# Patient Record
Sex: Female | Born: 1945 | Race: White | Hispanic: No | Marital: Married | Smoking: Former smoker
Health system: Southern US, Community
[De-identification: ages and names within clinical notes are randomized; demographics above are authoritative.]

## PROBLEM LIST (undated history)

## (undated) DIAGNOSIS — J449 Chronic obstructive pulmonary disease, unspecified: Secondary | ICD-10-CM

## (undated) HISTORY — PX: BACK SURGERY: SHX140

---

## 2020-06-22 ENCOUNTER — Encounter (HOSPITAL_COMMUNITY): Payer: Self-pay | Admitting: Emergency Medicine

## 2020-06-22 ENCOUNTER — Inpatient Hospital Stay (HOSPITAL_COMMUNITY)
Admission: EM | Admit: 2020-06-22 | Discharge: 2020-06-29 | DRG: 481 | Disposition: A | Discharge status: Skilled Nursing Facility | Payer: Medicare Other | Attending: Family Medicine | Admitting: Family Medicine

## 2020-06-22 ENCOUNTER — Other Ambulatory Visit: Payer: Self-pay

## 2020-06-22 ENCOUNTER — Emergency Department (HOSPITAL_COMMUNITY): Payer: Medicare Other

## 2020-06-22 DIAGNOSIS — Z6826 Body mass index (BMI) 26.0-26.9, adult: Secondary | ICD-10-CM

## 2020-06-22 DIAGNOSIS — D72828 Other elevated white blood cell count: Secondary | ICD-10-CM | POA: Diagnosis present

## 2020-06-22 DIAGNOSIS — I890 Lymphedema, not elsewhere classified: Secondary | ICD-10-CM | POA: Diagnosis present

## 2020-06-22 DIAGNOSIS — D62 Acute posthemorrhagic anemia: Secondary | ICD-10-CM | POA: Diagnosis not present

## 2020-06-22 DIAGNOSIS — S72002A Fracture of unspecified part of neck of left femur, initial encounter for closed fracture: Secondary | ICD-10-CM | POA: Diagnosis not present

## 2020-06-22 DIAGNOSIS — D649 Anemia, unspecified: Secondary | ICD-10-CM | POA: Diagnosis present

## 2020-06-22 DIAGNOSIS — R944 Abnormal results of kidney function studies: Secondary | ICD-10-CM | POA: Diagnosis present

## 2020-06-22 DIAGNOSIS — D72829 Elevated white blood cell count, unspecified: Secondary | ICD-10-CM

## 2020-06-22 DIAGNOSIS — S72142A Displaced intertrochanteric fracture of left femur, initial encounter for closed fracture: Principal | ICD-10-CM | POA: Diagnosis present

## 2020-06-22 DIAGNOSIS — I517 Cardiomegaly: Secondary | ICD-10-CM | POA: Diagnosis present

## 2020-06-22 DIAGNOSIS — J90 Pleural effusion, not elsewhere classified: Secondary | ICD-10-CM | POA: Diagnosis present

## 2020-06-22 DIAGNOSIS — J449 Chronic obstructive pulmonary disease, unspecified: Secondary | ICD-10-CM | POA: Diagnosis present

## 2020-06-22 DIAGNOSIS — Z20822 Contact with and (suspected) exposure to covid-19: Secondary | ICD-10-CM | POA: Diagnosis present

## 2020-06-22 DIAGNOSIS — R600 Localized edema: Secondary | ICD-10-CM | POA: Diagnosis present

## 2020-06-22 DIAGNOSIS — M25552 Pain in left hip: Secondary | ICD-10-CM | POA: Diagnosis not present

## 2020-06-22 DIAGNOSIS — J441 Chronic obstructive pulmonary disease with (acute) exacerbation: Secondary | ICD-10-CM | POA: Diagnosis present

## 2020-06-22 DIAGNOSIS — S72042A Displaced fracture of base of neck of left femur, initial encounter for closed fracture: Secondary | ICD-10-CM

## 2020-06-22 DIAGNOSIS — Z87891 Personal history of nicotine dependence: Secondary | ICD-10-CM

## 2020-06-22 DIAGNOSIS — Z807 Family history of other malignant neoplasms of lymphoid, hematopoietic and related tissues: Secondary | ICD-10-CM

## 2020-06-22 DIAGNOSIS — E441 Mild protein-calorie malnutrition: Secondary | ICD-10-CM | POA: Diagnosis present

## 2020-06-22 DIAGNOSIS — K7460 Unspecified cirrhosis of liver: Secondary | ICD-10-CM | POA: Diagnosis present

## 2020-06-22 DIAGNOSIS — R059 Cough, unspecified: Secondary | ICD-10-CM

## 2020-06-22 DIAGNOSIS — W19XXXA Unspecified fall, initial encounter: Secondary | ICD-10-CM

## 2020-06-22 DIAGNOSIS — Y9289 Other specified places as the place of occurrence of the external cause: Secondary | ICD-10-CM

## 2020-06-22 DIAGNOSIS — W1789XA Other fall from one level to another, initial encounter: Secondary | ICD-10-CM | POA: Diagnosis present

## 2020-06-22 DIAGNOSIS — Z801 Family history of malignant neoplasm of trachea, bronchus and lung: Secondary | ICD-10-CM

## 2020-06-22 HISTORY — DX: Chronic obstructive pulmonary disease, unspecified: J44.9

## 2020-06-22 LAB — CBC WITH DIFFERENTIAL/PLATELET
Abs Immature Granulocytes: 0.11 10*3/uL — ABNORMAL HIGH (ref 0.00–0.07)
Basophils Absolute: 0 10*3/uL (ref 0.0–0.1)
Basophils Relative: 1 %
Eosinophils Absolute: 0.1 10*3/uL (ref 0.0–0.5)
Eosinophils Relative: 1 %
HCT: 38 % (ref 36.0–46.0)
Hemoglobin: 11.9 g/dL — ABNORMAL LOW (ref 12.0–15.0)
Immature Granulocytes: 1 %
Lymphocytes Relative: 11 %
Lymphs Abs: 0.9 10*3/uL (ref 0.7–4.0)
MCH: 29.8 pg (ref 26.0–34.0)
MCHC: 31.3 g/dL (ref 30.0–36.0)
MCV: 95 fL (ref 80.0–100.0)
Monocytes Absolute: 0.9 10*3/uL (ref 0.1–1.0)
Monocytes Relative: 10 %
Neutro Abs: 6.5 10*3/uL (ref 1.7–7.7)
Neutrophils Relative %: 76 %
Platelets: 368 10*3/uL (ref 150–400)
RBC: 4 MIL/uL (ref 3.87–5.11)
RDW: 14.3 % (ref 11.5–15.5)
WBC: 8.5 10*3/uL (ref 4.0–10.5)
nRBC: 0 % (ref 0.0–0.2)

## 2020-06-22 LAB — HEPATIC FUNCTION PANEL
ALT: 13 U/L (ref 0–44)
AST: 16 U/L (ref 15–41)
Albumin: 3.2 g/dL — ABNORMAL LOW (ref 3.5–5.0)
Alkaline Phosphatase: 70 U/L (ref 38–126)
Bilirubin, Direct: 0.1 mg/dL (ref 0.0–0.2)
Indirect Bilirubin: 0.3 mg/dL (ref 0.3–0.9)
Total Bilirubin: 0.4 mg/dL (ref 0.3–1.2)
Total Protein: 6.3 g/dL — ABNORMAL LOW (ref 6.5–8.1)

## 2020-06-22 LAB — PROTIME-INR
INR: 1 (ref 0.8–1.2)
Prothrombin Time: 12.9 seconds (ref 11.4–15.2)

## 2020-06-22 LAB — BASIC METABOLIC PANEL
Anion gap: 6 (ref 5–15)
BUN: 26 mg/dL — ABNORMAL HIGH (ref 8–23)
CO2: 30 mmol/L (ref 22–32)
Calcium: 8.4 mg/dL — ABNORMAL LOW (ref 8.9–10.3)
Chloride: 100 mmol/L (ref 98–111)
Creatinine, Ser: 1.17 mg/dL — ABNORMAL HIGH (ref 0.44–1.00)
GFR, Estimated: 49 mL/min — ABNORMAL LOW (ref >60)
Glucose, Bld: 110 mg/dL — ABNORMAL HIGH (ref 70–99)
Potassium: 3.9 mmol/L (ref 3.5–5.1)
Sodium: 136 mmol/L (ref 135–145)

## 2020-06-22 LAB — TYPE AND SCREEN
ABO/RH(D): B POS
Antibody Screen: NEGATIVE

## 2020-06-22 LAB — RESP PANEL BY RT-PCR (FLU A&B, COVID) ARPGX2
Influenza A by PCR: NEGATIVE
Influenza B by PCR: NEGATIVE
SARS Coronavirus 2 by RT PCR: NEGATIVE

## 2020-06-22 MED ORDER — HYDROCODONE-ACETAMINOPHEN 5-325 MG PO TABS: 1.0000 | ORAL_TABLET | Freq: Four times a day (QID) | ORAL | Status: DC | PRN

## 2020-06-22 MED ORDER — ONDANSETRON HCL 4 MG PO TABS
4.0000 mg | ORAL_TABLET | Freq: Four times a day (QID) | ORAL | Status: DC | PRN
  Administered 2020-06-23: 4 mg via ORAL
  Filled 2020-06-22: qty 1

## 2020-06-22 MED ORDER — FENTANYL CITRATE (PF) 100 MCG/2ML IJ SOLN
75.0000 ug | Freq: Once | INTRAMUSCULAR | Status: AC
  Administered 2020-06-22: 75 ug via INTRAVENOUS

## 2020-06-22 MED ORDER — ACETAMINOPHEN 325 MG PO TABS
650.0000 mg | ORAL_TABLET | Freq: Four times a day (QID) | ORAL | Status: DC | PRN
  Administered 2020-06-24 – 2020-06-29 (×5): 650 mg via ORAL
  Filled 2020-06-22 (×5): qty 2

## 2020-06-22 MED ORDER — SENNOSIDES-DOCUSATE SODIUM 8.6-50 MG PO TABS
1.0000 | ORAL_TABLET | Freq: Two times a day (BID) | ORAL | Status: DC
  Administered 2020-06-23 – 2020-06-26 (×6): 1 via ORAL
  Filled 2020-06-22 (×6): qty 1

## 2020-06-22 MED ORDER — FENTANYL CITRATE (PF) 100 MCG/2ML IJ SOLN
INTRAMUSCULAR | Status: AC
  Filled 2020-06-22: qty 2

## 2020-06-22 MED ORDER — OXYCODONE HCL 5 MG PO TABS
5.0000 mg | ORAL_TABLET | Freq: Four times a day (QID) | ORAL | Status: DC | PRN
  Administered 2020-06-23 – 2020-06-28 (×10): 5 mg via ORAL
  Filled 2020-06-22 (×11): qty 1

## 2020-06-22 MED ORDER — ONDANSETRON HCL 4 MG/2ML IJ SOLN: 4.0000 mg | Freq: Four times a day (QID) | INTRAMUSCULAR | Status: DC | PRN

## 2020-06-22 MED ORDER — HYDROMORPHONE HCL 1 MG/ML IJ SOLN
0.5000 mg | INTRAMUSCULAR | Status: DC | PRN
  Administered 2020-06-22 – 2020-06-24 (×7): 0.5 mg via INTRAVENOUS
  Filled 2020-06-22: qty 0.5
  Filled 2020-06-22: qty 1
  Filled 2020-06-22 (×6): qty 0.5

## 2020-06-22 MED ORDER — ACETAMINOPHEN 650 MG RE SUPP: 650.0000 mg | Freq: Four times a day (QID) | RECTAL | Status: DC | PRN

## 2020-06-22 MED ORDER — HYDROMORPHONE HCL 1 MG/ML IJ SOLN
0.5000 mg | Freq: Once | INTRAMUSCULAR | Status: AC
Start: 2020-06-22 — End: 2020-06-22
  Administered 2020-06-22: 0.5 mg via INTRAVENOUS
  Filled 2020-06-22: qty 1

## 2020-06-22 MED ORDER — TRANEXAMIC ACID-NACL 1000-0.7 MG/100ML-% IV SOLN
1000.0000 mg | INTRAVENOUS | Status: DC
  Filled 2020-06-22: qty 100

## 2020-06-22 MED ORDER — CEFAZOLIN SODIUM-DEXTROSE 2-4 GM/100ML-% IV SOLN: 2.0000 g | INTRAVENOUS | Status: DC

## 2020-06-22 NOTE — ED Notes (Signed)
Pt requesting pain medication, EDPs made aware, orders to be placed.

## 2020-06-22 NOTE — ED Notes (Signed)
Pt son Hilaria Ota called, updated with pt consent. Made aware 1 visitor is allowed at pt bedside once he arrives.

## 2020-06-22 NOTE — ED Provider Notes (Signed)
[facility] EMERGENCY DEPARTMENT Provider Note   CSN: [id number] Arrival date & time: 06/22/20  2029     History Chief Complaint  Patient presents with   Alicia Vaughn is a 75 y.o. female.  HPI  Patient with significant medical history of COPD presents to the emergency department with chief complaint of left hip pain.  Patient states she fell off the back of a truck  approximately 4 feet onto the ground landing directly on her left hip.  She denies hitting her head, losing conscious, is not on anticoagulant.  Patient states she had severe pain in her left hip, was unable to ambulate or get up off the floor, states the pain stays in her left hip,  pain does not radiate down her leg, she denies paresthesia or weakness in lower extremities.  She denies neck, back pain, chest pain, she has no other complaints at this time.  Patient has not had any pain medication for this.  Patient denies headaches, fevers, chills, chest pain or shortness of breath.  Past Medical History:  Diagnosis Date   COPD (chronic obstructive pulmonary disease) [facility])     Patient Active Problem List   Diagnosis Date Noted   Closed left hip fracture, initial encounter (HCC) 06/22/2020    Past Surgical History:  Procedure Laterality Date   BACK SURGERY       OB History   No obstetric history on file.     History reviewed. No pertinent family history.  Social History   Tobacco Use   Smoking status: Former    Pack years: 0.00    Types: Cigarettes   Smokeless tobacco: Never  Vaping Use   Vaping Use: Never used  Substance Use Topics   Alcohol use: Not Currently   Drug use: Never    Home Medications Prior to Admission medications   Not on File    Allergies    Azithromycin and Vancomycin  Review of Systems   Review of Systems  Constitutional:  Negative for chills and fever.  HENT:  Negative for congestion.   Respiratory:  Negative for shortness of breath.   Cardiovascular:   Negative for chest pain.  Gastrointestinal:  Negative for abdominal pain.  Genitourinary:  Negative for enuresis.  Musculoskeletal:  Negative for back pain.       Left hip pain.   Skin:  Negative for rash.  Neurological:  Negative for headaches.  Hematological:  Does not bruise/bleed easily.   Physical Exam Updated Vital Signs BP 112/68   Pulse (!) 103   Temp 98.2 F (36.8 C) (Oral)   Resp (!) 23   Ht 4\' 11"  (1.499 m)   Wt 59 kg   SpO2 98%   BMI 26.26 kg/m   Physical Exam Vitals and nursing note reviewed.  Constitutional:      General: She is in acute distress.     Appearance: She is not ill-appearing.  HENT:     Head: Normocephalic and atraumatic.     Nose: No congestion.  Eyes:     Conjunctiva/sclera: Conjunctivae normal.  Cardiovascular:     Rate and Rhythm: Normal rate and regular rhythm.     Pulses: Normal pulses.     Heart sounds: No murmur heard.   No friction rub. No gallop.  Pulmonary:     Effort: No respiratory distress.     Breath sounds: No wheezing, rhonchi or rales.  Abdominal:     Palpations: Abdomen is soft.  Tenderness: There is no abdominal tenderness.  Musculoskeletal:     Right lower leg: Edema present.     Left lower leg: Edema present.     Comments: Patient spine was palpated nontender to palpation, no step-off or deformities present.  Chest was palpated was nontender to palpation.  There is no noted external or internal rotation of the hip, there appears to be slight left leg shortening.  She was diffusely tender on her left trochanter, there is no gross deformities present.  She had full range of motion at her toes ankle and knee, unable to flex at her left hip.  Neurovascular fully intact.  Noted that patient has 2+ pitting edema up to the knees.  No chronic skin changes present  Skin:    General: Skin is warm and dry.  Neurological:     Mental Status: She is alert.  Psychiatric:        Mood and Affect: Mood normal.    ED  Results / Procedures / Treatments   Labs (all labs ordered are listed, but only abnormal results are displayed) Labs Reviewed  BASIC METABOLIC PANEL - Abnormal; Notable for the following components:      Result Value   Glucose, Bld 110 (*)    BUN 26 (*)    Creatinine, Ser 1.17 (*)    Calcium 8.4 (*)    GFR, Estimated 49 (*)    All other components within normal limits  CBC WITH DIFFERENTIAL/PLATELET - Abnormal; Notable for the following components:   Hemoglobin 11.9 (*)    Abs Immature Granulocytes 0.11 (*)    All other components within normal limits  RESP PANEL BY RT-PCR (FLU A&B, COVID) ARPGX2  PROTIME-INR  HEPATIC FUNCTION PANEL  TYPE AND SCREEN    EKG None  Radiology DG Chest 1 View  Result Date: 06/22/2020 CLINICAL DATA:  Fall, chest injury EXAM: CHEST  1 VIEW COMPARISON:  None. FINDINGS: Small to moderate left and tiny right pleural effusions are present. There is left basilar atelectasis, however, there is superimposed left mid and lower lung zone focal pulmonary infiltrate, possibly infectious or inflammatory in the acute setting. Trace superimposed interstitial pulmonary edema, likely cardiogenic in nature. Cardiac size is mildly enlarged. No acute bone abnormality. IMPRESSION: Mild cardiogenic failure with bilateral pleural effusions and mild interstitial pulmonary edema. Mild cardiomegaly. Superimposed focal pulmonary infiltrate within the left mid and lower lung zone, possibly infectious or inflammatory in the acute setting. Electronically Signed   By: Helyn Numbers MD   On: 06/22/2020 21:52   DG Knee Complete 4 Views Left  Result Date: 06/22/2020 CLINICAL DATA:  Left knee pain EXAM: LEFT KNEE - COMPLETE 4+ VIEW COMPARISON:  None. FINDINGS: Four view radiograph left knee demonstrates normal alignment. No fracture or dislocation. Joint spaces are preserved. No effusion. There is extensive subcutaneous edema involving the lateral soft tissues of the distal thigh and proximal  left foreleg. IMPRESSION: Extensive lateral soft tissue swelling.  No fracture or dislocation. Electronically Signed   By: Helyn Numbers MD   On: 06/22/2020 21:54   DG Hip Unilat With Pelvis 2-3 Views Left  Result Date: 06/22/2020 CLINICAL DATA:  Fall, left hip pain EXAM: DG HIP (WITH OR WITHOUT PELVIS) 2-3V LEFT COMPARISON:  None FINDINGS: Single view radiograph pelvis and two view radiograph left hip demonstrates an acute, impacted basicervical femoral neck fracture. The fracture extends into the superior aspect of the lesser trochanter, however, this does not appear avulsed from the a distal fracture  fragment consisting of the femoral shaft. Moderate varus and mild dorsal angulation. Femoral head is still seated within the left acetabulum. At least mild to moderate superimposed left hip degenerative arthritis with joint space narrowing. The pelvis and right hip are intact. Mild right hip degenerative arthritis. IMPRESSION: Acute, impacted, angulated basicervical left femoral neck fracture. Superimposed moderate left hip degenerative arthritis. Electronically Signed   By: Helyn NumbersAshesh  Parikh MD   On: 06/22/2020 21:49    Procedures Procedures   Medications Ordered in ED Medications  ceFAZolin (ANCEF) IVPB 2g/100 mL premix (has no administration in time range)  tranexamic acid (CYKLOKAPRON) IVPB 1,000 mg (has no administration in time range)  fentaNYL (SUBLIMAZE) injection 75 mcg (75 mcg Intravenous Given 06/22/20 2044)  HYDROmorphone (DILAUDID) injection 0.5 mg (0.5 mg Intravenous Given 06/22/20 2200)    ED Course  I have reviewed the triage vital signs and the nursing notes.  Pertinent labs & imaging results that were available during my care of the patient were reviewed by me and considered in my medical decision making (see chart for details).    MDM Rules/Calculators/A&P                         Initial impression-Patient presents with left hip pain, she is alert, Appears to be in acute distress,  vital signs noted for tachycardia.  Concern for orthopedic injury Will obtain imaging provide patient with pain medications and reassess.  Work-up-CBC shows normocytic anemia hemoglobin 11.9, BMP shows hyperglycemia 110, BUN 26, creatinine 1.17, respiratory panel negative for acute findings, prothrombin time and INR unremarkable.  Unilateral left hip reveals acute impacted angulated by cervical left femoral neck fracture.  Chest x-ray reveals mild cardiogenic failure with bilateral pleural effusions and mild interstitial pulmonary edema.  Superimposed focal pulmonary infiltrate within the left and lower lung zones possible for infectious versus inflammatory.  X-ray of knee is negative for acute findings.  Reassessment-patient was reassessed after pain medications updated her on imaging and recommend admission for surgical fixation of the left hip.  Patient is in agreement with this.  Discussed left-sided pleural effusion patient states that she has cirrhosis of the liver and has had pleuracentesis 4 times in the past.  She is generally on fluid pills which help with her edema.  All of her health records from [facility] [place].  Consult-spoke with Dr. Jinny Blossomarins of orthopedic surgery he recommends medical admission and will plan for surgery tomorrow.  Spoke with Dr. Robb Matarrtiz he will admit the patient.  Rule out-low suspicion for intracranial head bleed as patient denies hitting her head, losing conscious, she denies headaches, change in vision, paresthesia or weakness in the upper or lower extremity, no focal deficit present on exam.  Low suspicion for spinal cord abnormality or spinal fracture spine was palpated nontender to palpation, patient is moving all 4 extremities.  Low suspicion for rib fracture or pneumothorax as ribs are nontender to palpation, chest x-ray neck for acute findings.  It is noted that patient has left pleural effusion as well as bilateral pedal edema but I suspect this secondary due  to cirrhosis and will most likely need diuresis.  Low suspicion for intra-abdominal abnormality as abdomen soft nontender to palpation.  Plan-admit to medicine for management of comorbidities and Dr. Jinny Blossomarins of orthopedic surgical plan for hip replacement.    Final Clinical Impression(s) / ED Diagnoses Final diagnoses:  Fall, initial encounter  Closed fracture of left hip, initial encounter (HCC)  Rx / DC Orders ED Discharge Orders     None        Barnie Del 06/22/20 2322    Eber Hong, MD 06/23/20 [phone]

## 2020-06-22 NOTE — ED Triage Notes (Signed)
Pt brought in by [facility] after she fell backwards approximately 4 ft out of a camper and landed on her L hip. Here with c/o L hip pain.

## 2020-06-22 NOTE — H&P (Signed)
History and Physical    Alicia Vaughn MLY:[id number] DOB: 12-16-1945 DOA: 06/22/2020  PCP: Veverly Fells, MD   Patient coming from: Home.  I have personally briefly reviewed patient's old medical records in [facility]  Chief Complaint: Fall.  HPI: Alicia Vaughn is a 75 y.o. female with medical history significant of COPD, unspecified liver cirrhosis, stage II lymphedema who is coming to the emergency department due to having an accidental fall from a truck at home landing on her left hip area resulting in immediate pain and inability to stand or bear weight on her LLE.  She denies any type of prodromal symptoms.  She had another fall about a month ago due to lightheadedness, but did not sustain any injuries.  However, she denies chest pain, dizziness, dyspnea, diaphoresis, PND or orthopnea.  She gets frequent lower extremity edema.  She denies abdominal pain, nausea, vomiting, diarrhea, constipation, melena or hematochezia.  No flank pain, dysuria, frequency or hematuria.  Denies polyuria, polydipsia, polyphagia or blurred vision.  ED Course: Initial vital signs were temperature 98.2 F, pulse 103, respirations 22, BP 1 3767 mmHg and O2 sat 97% on room air.  The patient received 75 mcg of fentanyl IVP then later had 0.5 mg of hydromorphone IVP.  Labwork: CBC showed a white count of 8.5, hemoglobin 11.9 g/dL platelets 812.  PT and INR were normal.  BMP showed normal electrolytes when calcium corrected to albumin.  Glucose 110, BUN 26 and creatinine 1.17 mg/dL.  Hepatic functions showed a total protein of 6.3 and albumin of 3.2 g/dL.  Imaging: Chest radiograph showed mild cardiogenic failure with bilateral pleural effusions and mild interstitial pulmonary edema.  There was mild cardiomegaly.  Superimposed focal pulmonary infiltrate within the mid and left lower lung zone possibly infectious or inflammatory in the acute setting.  Left knee x-rays show extensive lateral soft tissue swelling, but  no fracture or dislocation.  Left hip x-rays show an acute, impacted, angulated basicervical left femoral neck fracture.  Superimposed moderate left hip degenerative arthritis.  Please see images and full radiology report for further detail.  Review of Systems: As per HPI otherwise all other systems reviewed and are negative.  Past Medical History:  Diagnosis Date   COPD (chronic obstructive pulmonary disease) (HCC)     Past Surgical History:  Procedure Laterality Date   BACK SURGERY      Social History  reports that she has quit smoking. Her smoking use included cigarettes. She has never used smokeless tobacco. She reports previous alcohol use. She reports that she does not use drugs.  Allergies  Allergen Reactions   Azithromycin     Allergic to all MYCINS   Vancomycin    Family medical history Brother with lymphoma. Brother with lung cancer.  Prior to Admission medications   Not on File  The patient could not tell me much details about her medications.  Physical Exam: Vitals:   06/22/20 2200 06/22/20 2210 06/22/20 2213 06/22/20 2230  BP: (!) 106/56   112/68  Pulse: 97 (!) 105 (!) 107 (!) 103  Resp: (!) 30 19 16  (!) 23  Temp:      TempSrc:      SpO2: 97% (!) 84% 97% 98%  Weight:      Height:        Constitutional: Looks chronically ill.  NAD, calm, comfortable Eyes: PERRL, lids and conjunctivae normal ENMT: Mucous membranes are dry.  posterior pharynx clear of any exudate or lesions. Neck: normal,  supple, no masses, no thyromegaly Respiratory: Decreased breath sounds on bases, otherwise clear to auscultation bilaterally, no wheezing, no crackles. Normal respiratory effort. No accessory muscle use.  Cardiovascular: Regular rate and rhythm, no murmurs / rubs / gallops.  Stage II lymphedema with mild pitting edema. 2+ pedal pulses. No carotid bruits.  Abdomen: Bowel sounds positive.  Soft, no tenderness, no masses palpated. No hepatosplenomegaly. Musculoskeletal: no  clubbing / cyanosis.  Generalized weakness.  Shortened and externally rotated LLE.  No contractures. Normal muscle tone.  Skin: Linear ecchymosis on anterior left ankle area (not related to this fall, unknown cause per patient) Neurologic: CN 2-12 grossly intact. Sensation intact, DTR normal. Strength 5/5 in all 4.  Psychiatric: Normal judgment and insight. Alert and oriented x 3. Normal mood.   Labs on Admission: I have personally reviewed following labs and imaging studies  CBC: Recent Labs  Lab 06/22/20 2048  WBC 8.5  NEUTROABS 6.5  HGB 11.9*  HCT 38.0  MCV 95.0  PLT 368    Basic Metabolic Panel: Recent Labs  Lab 06/22/20 2048  NA 136  K 3.9  CL 100  CO2 30  GLUCOSE 110*  BUN 26*  CREATININE 1.17*  CALCIUM 8.4*    GFR: Estimated Creatinine Clearance: 33 mL/min (A) (by C-G formula based on SCr of 1.17 mg/dL (H)).  Liver Function Tests: Recent Labs  Lab 06/22/20 2048  AST 16  ALT 13  ALKPHOS 70  BILITOT 0.4  PROT 6.3*  ALBUMIN 3.2*    Urine analysis: No results found for: COLORURINE, APPEARANCEUR, LABSPEC, PHURINE, GLUCOSEU, HGBUR, BILIRUBINUR, KETONESUR, PROTEINUR, UROBILINOGEN, NITRITE, LEUKOCYTESUR  Radiological Exams on Admission: DG Chest 1 View  Result Date: 06/22/2020 CLINICAL DATA:  Fall, chest injury EXAM: CHEST  1 VIEW COMPARISON:  None. FINDINGS: Small to moderate left and tiny right pleural effusions are present. There is left basilar atelectasis, however, there is superimposed left mid and lower lung zone focal pulmonary infiltrate, possibly infectious or inflammatory in the acute setting. Trace superimposed interstitial pulmonary edema, likely cardiogenic in nature. Cardiac size is mildly enlarged. No acute bone abnormality. IMPRESSION: Mild cardiogenic failure with bilateral pleural effusions and mild interstitial pulmonary edema. Mild cardiomegaly. Superimposed focal pulmonary infiltrate within the left mid and lower lung zone, possibly infectious  or inflammatory in the acute setting. Electronically Signed   By: Helyn Numbers MD   On: 06/22/2020 21:52   DG Knee Complete 4 Views Left  Result Date: 06/22/2020 CLINICAL DATA:  Left knee pain EXAM: LEFT KNEE - COMPLETE 4+ VIEW COMPARISON:  None. FINDINGS: Four view radiograph left knee demonstrates normal alignment. No fracture or dislocation. Joint spaces are preserved. No effusion. There is extensive subcutaneous edema involving the lateral soft tissues of the distal thigh and proximal left foreleg. IMPRESSION: Extensive lateral soft tissue swelling.  No fracture or dislocation. Electronically Signed   By: Helyn Numbers MD   On: 06/22/2020 21:54   DG Hip Unilat With Pelvis 2-3 Views Left  Result Date: 06/22/2020 CLINICAL DATA:  Fall, left hip pain EXAM: DG HIP (WITH OR WITHOUT PELVIS) 2-3V LEFT COMPARISON:  None FINDINGS: Single view radiograph pelvis and two view radiograph left hip demonstrates an acute, impacted basicervical femoral neck fracture. The fracture extends into the superior aspect of the lesser trochanter, however, this does not appear avulsed from the a distal fracture fragment consisting of the femoral shaft. Moderate varus and mild dorsal angulation. Femoral head is still seated within the left acetabulum. At least mild to  moderate superimposed left hip degenerative arthritis with joint space narrowing. The pelvis and right hip are intact. Mild right hip degenerative arthritis. IMPRESSION: Acute, impacted, angulated basicervical left femoral neck fracture. Superimposed moderate left hip degenerative arthritis. Electronically Signed   By: Helyn Numbers MD   On: 06/22/2020 21:49    EKG: Independently reviewed.  Still pending.  Assessment/Plan Principal Problem:   Closed left hip fracture, initial encounter (HCC) Admit to telemetry/inpatient. Keep NPO. Buck's traction per protocol. Obtain preop EKG. Oxycodone 5 mg every 6 hours as needed. Hydromorphone 0.25 mg IVP q 2hr  PRN. Consult orthopedic surgeries. Consult TOC team. Consult nutritional services.  Active Problems:   Normocytic anemia Unknown baseline. In the setting of acute femoral/hip fracture. Monitor hematocrit and hemoglobin.    COPD (chronic obstructive pulmonary disease) (HCC) Asymptomatic at this time. Supplemental oxygen as needed. Short acting beta agonist PRN.    Decreased GFR Unknown baseline. Monitor renal function electrolytes.    Bilateral lower extremity edema This is more of a lymphedema issue than pitting edema.      Mild protein malnutrition (HCC) Protein supplementation. Consult nutritional services.    Cardiomegaly Check EKG. Obtain echocardiogram. Check BNP with morning labs.   DVT prophylaxis: SCDs preoperatively. Code Status:   Full code. Family Communication:   Disposition Plan:   Patient is from:  Home.  Anticipated DC to:  Temporary rehab.  Anticipated DC date:  06/26/2020.  Anticipated DC barriers: Surgical intervention/postop period.  Consults called:  Orthopedic surgery. Admission status:  Inpatient/telemetry.   Severity of Illness: High severity since the patient has a complex fracture that will need surgical repair.  Bobette Mo MD [facility]  How to contact the [facility] Attending or Consulting provider 7A - 7P or covering provider during after hours 7P -7A, for this patient?   Check the care team in [facility] and look for a) attending/consulting [facility] provider listed and b) the [facility] team listed Log into [email or web address] and use [facility] universal password to access. If you do not have the password, please contact the hospital operator. Locate the [facility] provider you are looking for under [facility] and page to a number that you can be directly reached. If you still have difficulty reaching the provider, please page the [facility] (Director on Call) for the Hospitalists listed on amion for assistance.  06/22/2020, 11:36 PM   This document  was prepared using Dragon voice recognition software and may contain some unintended transcription errors.

## 2020-06-23 ENCOUNTER — Encounter (HOSPITAL_COMMUNITY)
Admission: EM | Disposition: A | Discharge status: Skilled Nursing Facility | Payer: Self-pay | Source: Home / Self Care | Attending: Family Medicine

## 2020-06-23 ENCOUNTER — Encounter (HOSPITAL_COMMUNITY): Payer: Self-pay | Admitting: Internal Medicine

## 2020-06-23 ENCOUNTER — Other Ambulatory Visit (HOSPITAL_COMMUNITY): Payer: Self-pay

## 2020-06-23 ENCOUNTER — Inpatient Hospital Stay (HOSPITAL_COMMUNITY): Payer: Medicare Other | Admitting: Anesthesiology

## 2020-06-23 ENCOUNTER — Inpatient Hospital Stay (HOSPITAL_COMMUNITY): Payer: Medicare Other

## 2020-06-23 DIAGNOSIS — J441 Chronic obstructive pulmonary disease with (acute) exacerbation: Secondary | ICD-10-CM | POA: Diagnosis present

## 2020-06-23 DIAGNOSIS — Z87891 Personal history of nicotine dependence: Secondary | ICD-10-CM | POA: Diagnosis not present

## 2020-06-23 DIAGNOSIS — E441 Mild protein-calorie malnutrition: Secondary | ICD-10-CM | POA: Diagnosis present

## 2020-06-23 DIAGNOSIS — Z807 Family history of other malignant neoplasms of lymphoid, hematopoietic and related tissues: Secondary | ICD-10-CM | POA: Diagnosis not present

## 2020-06-23 DIAGNOSIS — Z801 Family history of malignant neoplasm of trachea, bronchus and lung: Secondary | ICD-10-CM | POA: Diagnosis not present

## 2020-06-23 DIAGNOSIS — D72828 Other elevated white blood cell count: Secondary | ICD-10-CM | POA: Diagnosis present

## 2020-06-23 DIAGNOSIS — S72002A Fracture of unspecified part of neck of left femur, initial encounter for closed fracture: Secondary | ICD-10-CM

## 2020-06-23 DIAGNOSIS — K7460 Unspecified cirrhosis of liver: Secondary | ICD-10-CM | POA: Diagnosis present

## 2020-06-23 DIAGNOSIS — D649 Anemia, unspecified: Secondary | ICD-10-CM | POA: Diagnosis present

## 2020-06-23 DIAGNOSIS — D62 Acute posthemorrhagic anemia: Secondary | ICD-10-CM | POA: Diagnosis not present

## 2020-06-23 DIAGNOSIS — S72142A Displaced intertrochanteric fracture of left femur, initial encounter for closed fracture: Secondary | ICD-10-CM | POA: Diagnosis present

## 2020-06-23 DIAGNOSIS — J449 Chronic obstructive pulmonary disease, unspecified: Secondary | ICD-10-CM

## 2020-06-23 DIAGNOSIS — R600 Localized edema: Secondary | ICD-10-CM

## 2020-06-23 DIAGNOSIS — Y9289 Other specified places as the place of occurrence of the external cause: Secondary | ICD-10-CM | POA: Diagnosis not present

## 2020-06-23 DIAGNOSIS — I517 Cardiomegaly: Secondary | ICD-10-CM | POA: Diagnosis not present

## 2020-06-23 DIAGNOSIS — M25552 Pain in left hip: Secondary | ICD-10-CM | POA: Diagnosis present

## 2020-06-23 DIAGNOSIS — Z6826 Body mass index (BMI) 26.0-26.9, adult: Secondary | ICD-10-CM | POA: Diagnosis not present

## 2020-06-23 DIAGNOSIS — I890 Lymphedema, not elsewhere classified: Secondary | ICD-10-CM | POA: Diagnosis present

## 2020-06-23 DIAGNOSIS — W1789XA Other fall from one level to another, initial encounter: Secondary | ICD-10-CM | POA: Diagnosis present

## 2020-06-23 DIAGNOSIS — J90 Pleural effusion, not elsewhere classified: Secondary | ICD-10-CM | POA: Diagnosis present

## 2020-06-23 DIAGNOSIS — Z20822 Contact with and (suspected) exposure to covid-19: Secondary | ICD-10-CM | POA: Diagnosis present

## 2020-06-23 HISTORY — PX: INTRAMEDULLARY (IM) NAIL INTERTROCHANTERIC: SHX5875

## 2020-06-23 LAB — COMPREHENSIVE METABOLIC PANEL
ALT: 14 U/L (ref 0–44)
AST: 15 U/L (ref 15–41)
Albumin: 3.1 g/dL — ABNORMAL LOW (ref 3.5–5.0)
Alkaline Phosphatase: 68 U/L (ref 38–126)
Anion gap: 6 (ref 5–15)
BUN: 25 mg/dL — ABNORMAL HIGH (ref 8–23)
CO2: 29 mmol/L (ref 22–32)
Calcium: 8.7 mg/dL — ABNORMAL LOW (ref 8.9–10.3)
Chloride: 104 mmol/L (ref 98–111)
Creatinine, Ser: 0.83 mg/dL (ref 0.44–1.00)
GFR, Estimated: >60 mL/min (ref >60)
Glucose, Bld: 119 mg/dL — ABNORMAL HIGH (ref 70–99)
Potassium: 3.6 mmol/L (ref 3.5–5.1)
Sodium: 139 mmol/L (ref 135–145)
Total Bilirubin: 0.4 mg/dL (ref 0.3–1.2)
Total Protein: 5.9 g/dL — ABNORMAL LOW (ref 6.5–8.1)

## 2020-06-23 LAB — BRAIN NATRIURETIC PEPTIDE: B Natriuretic Peptide: 91 pg/mL (ref 0.0–100.0)

## 2020-06-23 LAB — CBC
HCT: 38.3 % (ref 36.0–46.0)
Hemoglobin: 11.8 g/dL — ABNORMAL LOW (ref 12.0–15.0)
MCH: 29.4 pg (ref 26.0–34.0)
MCHC: 30.8 g/dL (ref 30.0–36.0)
MCV: 95.5 fL (ref 80.0–100.0)
Platelets: 358 10*3/uL (ref 150–400)
RBC: 4.01 MIL/uL (ref 3.87–5.11)
RDW: 14.3 % (ref 11.5–15.5)
WBC: 11.7 10*3/uL — ABNORMAL HIGH (ref 4.0–10.5)
nRBC: 0 % (ref 0.0–0.2)

## 2020-06-23 LAB — SURGICAL PCR SCREEN
MRSA, PCR: NEGATIVE
Staphylococcus aureus: NEGATIVE

## 2020-06-23 LAB — GLUCOSE, CAPILLARY: Glucose-Capillary: 115 mg/dL — ABNORMAL HIGH (ref 70–99)

## 2020-06-23 LAB — ABO/RH: ABO/RH(D): B POS

## 2020-06-23 SURGERY — FIXATION, FRACTURE, INTERTROCHANTERIC, WITH INTRAMEDULLARY ROD
Anesthesia: General | Site: Hip | Laterality: Left

## 2020-06-23 MED ORDER — HYDROMORPHONE HCL 1 MG/ML IJ SOLN
0.2500 mg | INTRAMUSCULAR | Status: DC | PRN
  Administered 2020-06-23 (×3): 0.5 mg via INTRAVENOUS
  Filled 2020-06-23 (×3): qty 0.5

## 2020-06-23 MED ORDER — CEFAZOLIN SODIUM-DEXTROSE 1-4 GM/50ML-% IV SOLN
1.0000 g | Freq: Three times a day (TID) | INTRAVENOUS | Status: AC
  Administered 2020-06-23 – 2020-06-24 (×3): 1 g via INTRAVENOUS
  Filled 2020-06-23 (×4): qty 50

## 2020-06-23 MED ORDER — EPHEDRINE SULFATE 50 MG/ML IJ SOLN
INTRAMUSCULAR | Status: DC | PRN
  Administered 2020-06-23: 10 mg via INTRAVENOUS
  Administered 2020-06-23 (×2): 5 mg via INTRAVENOUS

## 2020-06-23 MED ORDER — PROPOFOL 10 MG/ML IV BOLUS
INTRAVENOUS | Status: AC
  Filled 2020-06-23: qty 20

## 2020-06-23 MED ORDER — TRANEXAMIC ACID-NACL 1000-0.7 MG/100ML-% IV SOLN
1000.0000 mg | INTRAVENOUS | Status: AC
  Administered 2020-06-23: 1000 mg via INTRAVENOUS
  Filled 2020-06-23: qty 100

## 2020-06-23 MED ORDER — PHENYLEPHRINE 40 MCG/ML (10ML) SYRINGE FOR IV PUSH (FOR BLOOD PRESSURE SUPPORT)
PREFILLED_SYRINGE | INTRAVENOUS | Status: DC | PRN
  Administered 2020-06-23: 80 ug via INTRAVENOUS

## 2020-06-23 MED ORDER — MIDAZOLAM HCL 5 MG/5ML IJ SOLN
INTRAMUSCULAR | Status: DC | PRN
  Administered 2020-06-23: 2 mg via INTRAVENOUS

## 2020-06-23 MED ORDER — FENTANYL CITRATE (PF) 100 MCG/2ML IJ SOLN
50.0000 ug | INTRAMUSCULAR | Status: AC | PRN
Start: 2020-06-23 — End: 2020-06-23
  Administered 2020-06-23 (×2): 25 ug via INTRAVENOUS
  Administered 2020-06-23: 50 ug via INTRAVENOUS

## 2020-06-23 MED ORDER — ONDANSETRON HCL 4 MG/2ML IJ SOLN
INTRAMUSCULAR | Status: AC
  Filled 2020-06-23: qty 2

## 2020-06-23 MED ORDER — IPRATROPIUM-ALBUTEROL 0.5-2.5 (3) MG/3ML IN SOLN: 3.0000 mL | Freq: Once | RESPIRATORY_TRACT | Status: DC

## 2020-06-23 MED ORDER — FENTANYL CITRATE (PF) 100 MCG/2ML IJ SOLN
INTRAMUSCULAR | Status: AC
  Administered 2020-06-23: 50 ug via INTRAVENOUS
  Filled 2020-06-23: qty 2

## 2020-06-23 MED ORDER — IPRATROPIUM-ALBUTEROL 0.5-2.5 (3) MG/3ML IN SOLN
RESPIRATORY_TRACT | Status: AC
  Administered 2020-06-23: 3 mL
  Filled 2020-06-23: qty 3

## 2020-06-23 MED ORDER — CHLORHEXIDINE GLUCONATE CLOTH 2 % EX PADS
6.0000 | MEDICATED_PAD | Freq: Every day | CUTANEOUS | Status: DC
  Administered 2020-06-23 – 2020-06-29 (×7): 6 via TOPICAL

## 2020-06-23 MED ORDER — FENTANYL CITRATE (PF) 100 MCG/2ML IJ SOLN
INTRAMUSCULAR | Status: AC
  Filled 2020-06-23: qty 2

## 2020-06-23 MED ORDER — PROPOFOL 10 MG/ML IV BOLUS
INTRAVENOUS | Status: DC | PRN
  Administered 2020-06-23: 35 ug/kg/min via INTRAVENOUS
  Administered 2020-06-23: 100 mg via INTRAVENOUS

## 2020-06-23 MED ORDER — SODIUM CHLORIDE 0.9 % IV SOLN: INTRAVENOUS | Status: DC

## 2020-06-23 MED ORDER — ONDANSETRON HCL 4 MG/2ML IJ SOLN
INTRAMUSCULAR | Status: DC | PRN
  Administered 2020-06-23: 4 mg via INTRAVENOUS

## 2020-06-23 MED ORDER — MUPIROCIN 2 % EX OINT
1.0000 "application " | TOPICAL_OINTMENT | Freq: Two times a day (BID) | CUTANEOUS | Status: AC
  Administered 2020-06-23 – 2020-06-27 (×10): 1 via NASAL
  Filled 2020-06-23 (×3): qty 22

## 2020-06-23 MED ORDER — BUPIVACAINE HCL (PF) 0.5 % IJ SOLN
INTRAMUSCULAR | Status: DC | PRN
  Administered 2020-06-23: 30 mL

## 2020-06-23 MED ORDER — CHLORHEXIDINE GLUCONATE 0.12 % MT SOLN
15.0000 mL | Freq: Once | OROMUCOSAL | Status: AC
  Administered 2020-06-23: 15 mL via OROMUCOSAL

## 2020-06-23 MED ORDER — BUPIVACAINE HCL (PF) 0.5 % IJ SOLN
INTRAMUSCULAR | Status: AC
  Filled 2020-06-23: qty 30

## 2020-06-23 MED ORDER — ORAL CARE MOUTH RINSE: 15.0000 mL | Freq: Once | OROMUCOSAL | Status: AC

## 2020-06-23 MED ORDER — SODIUM CHLORIDE 0.9 % IR SOLN
Status: DC | PRN
  Administered 2020-06-23: 1000 mL

## 2020-06-23 MED ORDER — DEXAMETHASONE SODIUM PHOSPHATE 4 MG/ML IJ SOLN
INTRAMUSCULAR | Status: DC | PRN
  Administered 2020-06-23: 4 mg via INTRAVENOUS

## 2020-06-23 MED ORDER — EPHEDRINE 5 MG/ML INJ
INTRAVENOUS | Status: AC
  Filled 2020-06-23: qty 10

## 2020-06-23 MED ORDER — CEFAZOLIN SODIUM-DEXTROSE 2-4 GM/100ML-% IV SOLN
2.0000 g | INTRAVENOUS | Status: AC
  Administered 2020-06-23: 2 g via INTRAVENOUS
  Filled 2020-06-23: qty 100

## 2020-06-23 MED ORDER — LACTATED RINGERS IV SOLN
INTRAVENOUS | Status: DC
  Administered 2020-06-23: 1000 mL via INTRAVENOUS

## 2020-06-23 MED ORDER — PHENYLEPHRINE 40 MCG/ML (10ML) SYRINGE FOR IV PUSH (FOR BLOOD PRESSURE SUPPORT)
PREFILLED_SYRINGE | INTRAVENOUS | Status: AC
  Filled 2020-06-23: qty 10

## 2020-06-23 MED ORDER — MIDAZOLAM HCL 2 MG/2ML IJ SOLN
INTRAMUSCULAR | Status: AC
  Filled 2020-06-23: qty 2

## 2020-06-23 SURGICAL SUPPLY — 59 items
BIT DRILL 4.0X280 (BIT) ×2 IMPLANT
BIT DRILL AO GAMMA 4.2X180 (BIT) IMPLANT
BLADE SURG SZ10 CARB STEEL (BLADE) ×4 IMPLANT
BNDG GAUZE ELAST 4 BULKY (GAUZE/BANDAGES/DRESSINGS) ×2 IMPLANT
BRUSH SCRUB EZ W/ULTRADEX 3%PC (MISCELLANEOUS) ×2 IMPLANT
CHLORAPREP W/TINT 26 (MISCELLANEOUS) ×2 IMPLANT
CLOTH BEACON ORANGE TIMEOUT ST (SAFETY) ×2 IMPLANT
COVER LIGHT HANDLE STERIS (MISCELLANEOUS) ×4 IMPLANT
COVER MAYO STAND XLG (MISCELLANEOUS) ×2 IMPLANT
COVER PERINEAL POST (MISCELLANEOUS) ×2 IMPLANT
COVER WAND RF STERILE (DRAPES) ×2 IMPLANT
DECANTER SPIKE VIAL GLASS SM (MISCELLANEOUS) ×2 IMPLANT
DRAPE INCISE IOBAN 44X35 STRL (DRAPES) IMPLANT
DRAPE STERI IOBAN 125X83 (DRAPES) ×2 IMPLANT
DRSG PAD ABDOMINAL 8X10 ST (GAUZE/BANDAGES/DRESSINGS) ×2 IMPLANT
DRSG TEGADERM 4X4.75 (GAUZE/BANDAGES/DRESSINGS) ×8 IMPLANT
ELECT REM PT RETURN 9FT ADLT (ELECTROSURGICAL) ×2
ELECTRODE REM PT RTRN 9FT ADLT (ELECTROSURGICAL) ×1 IMPLANT
GAUZE SPONGE 4X4 12PLY STRL (GAUZE/BANDAGES/DRESSINGS) ×2 IMPLANT
GAUZE XEROFORM 1X8 LF (GAUZE/BANDAGES/DRESSINGS) ×4 IMPLANT
GLOVE SKINSENSE NS SZ8.0 LF (GLOVE) ×2
GLOVE SKINSENSE STRL SZ8.0 LF (GLOVE) ×2 IMPLANT
GLOVE SRG 8 PF TXTR STRL LF DI (GLOVE) ×1 IMPLANT
GLOVE SURG UNDER POLY LF SZ7 (GLOVE) ×4 IMPLANT
GLOVE SURG UNDER POLY LF SZ8 (GLOVE) ×1
GOWN STRL REUS W/ TWL XL LVL3 (GOWN DISPOSABLE) ×1 IMPLANT
GOWN STRL REUS W/TWL LRG LVL3 (GOWN DISPOSABLE) ×2 IMPLANT
GOWN STRL REUS W/TWL XL LVL3 (GOWN DISPOSABLE) ×1
GUIDEROD T2 3X1000 (ROD) IMPLANT
GUIDEWIRE BALL NOSE 3.0X900 (WIRE) ×1
GUIDEWIRE ORTH 900X3XBALL NOSE (WIRE) ×1 IMPLANT
INST SET MAJOR BONE (KITS) ×2 IMPLANT
K-WIRE  3.2X450M STR (WIRE)
K-WIRE 3.2X450M STR (WIRE)
KIT BLADEGUARD II DBL (SET/KITS/TRAYS/PACK) ×2 IMPLANT
KIT TURNOVER CYSTO (KITS) ×2 IMPLANT
KWIRE 3.2X450M STR (WIRE) IMPLANT
MANIFOLD NEPTUNE II (INSTRUMENTS) ×2 IMPLANT
MARKER SKIN DUAL TIP RULER LAB (MISCELLANEOUS) ×2 IMPLANT
NAIL TROCH FEM ES 10X33X125 ×2 IMPLANT
NEEDLE HYPO 21X1.5 SAFETY (NEEDLE) ×2 IMPLANT
NS IRRIG 1000ML POUR BTL (IV SOLUTION) ×2 IMPLANT
PACK BASIC III (CUSTOM PROCEDURE TRAY) ×1
PACK SRG BSC III STRL LF ECLPS (CUSTOM PROCEDURE TRAY) ×1 IMPLANT
PAD ARMBOARD 7.5X6 YLW CONV (MISCELLANEOUS) ×2 IMPLANT
PENCIL SMOKE EVACUATOR COATED (MISCELLANEOUS) ×2 IMPLANT
PIN GUIDE THRD AR 3.2X330 (PIN) ×6 IMPLANT
SCREW LOCK CORT 5.0X38 (Screw) ×2 IMPLANT
SCREW SOLID LEFTY LAG 10.5X90 (Screw) ×2 IMPLANT
SET BASIN LINEN APH (SET/KITS/TRAYS/PACK) ×2 IMPLANT
SPONGE LAP 18X18 RF (DISPOSABLE) ×4 IMPLANT
STAPLER VISISTAT (STAPLE) ×2 IMPLANT
SUT MON AB 2-0 CT1 36 (SUTURE) ×2 IMPLANT
SUT VIC AB 0 CT1 27 (SUTURE) ×1
SUT VIC AB 0 CT1 27XBRD ANTBC (SUTURE) ×1 IMPLANT
SYR 30ML LL (SYRINGE) ×2 IMPLANT
SYR BULB IRRIG 60ML STRL (SYRINGE) ×4 IMPLANT
TRAY FOLEY MTR SLVR 16FR STAT (SET/KITS/TRAYS/PACK) ×2 IMPLANT
YANKAUER SUCT BULB TIP NO VENT (SUCTIONS) ×2 IMPLANT

## 2020-06-23 NOTE — Progress Notes (Signed)
PROGRESS NOTE   Alicia Vaughn  CXK:[id number] DOB: 03-20-1945 DOA: 06/22/2020 PCP: Veverly Fells, MD   Chief Complaint  Patient presents with   Fall   Level of care: Telemetry  Brief Admission History:  75 y.o. female with medical history significant of COPD, unspecified liver cirrhosis, stage II lymphedema who is coming to the emergency department due to having an accidental fall from a truck at home landing on her left hip area resulting in immediate pain and inability to stand or bear weight on her LLE.  She was admitted for operative repair.    Assessment & Plan:   Principal Problem:   Closed left hip fracture, initial encounter (HCC) Active Problems:   Normocytic anemia   COPD (chronic obstructive pulmonary disease) (HCC)   Decreased GFR   Bilateral lower extremity edema   Mild protein malnutrition (HCC)   Cardiomegaly  Acute left femur fracture - Pt to go to OR today with Dr. Dallas Schimke for operative management.   Normocytic anemia-stable anticipate some postop anemia and will follow closely.  COPD-stable, bronchodilators as ordered.  Lymphedema bilateral lower extremities  Mild protein calorie malnutrition-dietitian consultation as part of hip surgery work-up  Cardiomegaly-2D echocardiogram ordered and pending  DVT prophylaxis: SCDs Code Status: Full Family Communication: Patient updated with plan of care at bedside Disposition: To be determined Status is: Inpatient  Remains inpatient appropriate because:Ongoing active pain requiring inpatient pain management  Dispo: The patient is from: Home              Anticipated d/c is to: SNF              Patient currently is not medically stable to d/c.   Difficult to place patient No   Consultants:  Dr. Dallas Schimke orthopedics  Procedures:  Cephalomedullary nail for left femoral neck fracture  Antimicrobials:     Subjective: Pt seen prior to operation.  Pt complaining of left leg pain and discomfort.    Objective: Vitals:   06/23/20 1515 06/23/20 1529 06/23/20 1530 06/23/20 1553  BP: 115/65  124/65 111/60  Pulse: 87 95 91 90  Resp: 11 15 12 16   Temp:  97.9 F (36.6 C)  (!) 97.5 F (36.4 C)  TempSrc:    Oral  SpO2: 100% 100% 100% 100%  Weight:      Height:        Intake/Output Summary (Last 24 hours) at 06/23/2020 1630 Last data filed at 06/23/2020 1421 Gross per 24 hour  Intake 1400 ml  Output 550 ml  Net 850 ml   Filed Weights   06/22/20 2033  Weight: 59 kg    Examination:  General exam: elderly female, awake, oriented x 3, Appears calm and comfortable  Respiratory system: Clear to auscultation. Respiratory effort normal. Cardiovascular system: normal S1 & S2 heard. No JVD, murmurs, rubs, gallops or clicks. No pedal edema. Gastrointestinal system: Abdomen is nondistended, soft and nontender. No organomegaly or masses felt. Normal bowel sounds heard. Central nervous system: Alert and oriented. No focal neurological deficits. Extremities: left leg shortened and externally rotated.  Good pedal pulses BLEs.  Skin: No rashes, lesions or ulcers Psychiatry: Judgement and insight appear normal. Mood & affect appropriate.   Data Reviewed: I have personally reviewed following labs and imaging studies  CBC: Recent Labs  Lab 06/22/20 2048 06/23/20 0608  WBC 8.5 11.7*  NEUTROABS 6.5  --   HGB 11.9* 11.8*  HCT 38.0 38.3  MCV 95.0 95.5  PLT 368 358  Basic Metabolic Panel: Recent Labs  Lab 06/22/20 2048 06/23/20 0608  NA 136 139  K 3.9 3.6  CL 100 104  CO2 30 29  GLUCOSE 110* 119*  BUN 26* 25*  CREATININE 1.17* 0.83  CALCIUM 8.4* 8.7*    GFR: Estimated Creatinine Clearance: 46.5 mL/min (by C-G formula based on SCr of 0.83 mg/dL).  Liver Function Tests: Recent Labs  Lab 06/22/20 2048 06/23/20 0608  AST 16 15  ALT 13 14  ALKPHOS 70 68  BILITOT 0.4 0.4  PROT 6.3* 5.9*  ALBUMIN 3.2* 3.1*    CBG: Recent Labs  Lab 06/23/20 0728  GLUCAP 115*     Recent Results (from the past 240 hour(s))  Resp Panel by RT-PCR (Flu A&B, Covid) Nasopharyngeal Swab     Status: None   Collection Time: 06/22/20  9:39 PM   Specimen: Nasopharyngeal Swab; Nasopharyngeal(NP) swabs in vial transport medium  Result Value Ref Range Status   SARS Coronavirus 2 by RT PCR NEGATIVE NEGATIVE Final    Comment: (NOTE) SARS-CoV-2 target nucleic acids are NOT DETECTED.  The SARS-CoV-2 RNA is generally detectable in upper respiratory specimens during the acute phase of infection. The lowest concentration of SARS-CoV-2 viral copies this assay can detect is 138 copies/mL. A negative result does not preclude SARS-Cov-2 infection and should not be used as the sole basis for treatment or other patient management decisions. A negative result may occur with  improper specimen collection/handling, submission of specimen other than nasopharyngeal swab, presence of viral mutation(s) within the areas targeted by this assay, and inadequate number of viral copies(<138 copies/mL). A negative result must be combined with clinical observations, patient history, and epidemiological information. The expected result is Negative.  Fact Sheet for Patients:  BloggerCourse.com  Fact Sheet for Healthcare Providers:  SeriousBroker.it  This test is no t yet approved or cleared by the [place] FDA and  has been authorized for detection and/or diagnosis of SARS-CoV-2 by FDA under an Emergency Use Authorization (EUA). This EUA will remain  in effect (meaning this test can be used) for the duration of the COVID-19 declaration under Section 564(b)(1) of the Act, 21 U.S.C.section 360bbb-3(b)(1), unless the authorization is terminated  or revoked sooner.       Influenza A by PCR NEGATIVE NEGATIVE Final   Influenza B by PCR NEGATIVE NEGATIVE Final    Comment: (NOTE) The Xpert Xpress SARS-CoV-2/FLU/RSV plus assay is intended as an  aid in the diagnosis of influenza from Nasopharyngeal swab specimens and should not be used as a sole basis for treatment. Nasal washings and aspirates are unacceptable for Xpert Xpress SARS-CoV-2/FLU/RSV testing.  Fact Sheet for Patients: BloggerCourse.com  Fact Sheet for Healthcare Providers: SeriousBroker.it  This test is not yet approved or cleared by the [place] FDA and has been authorized for detection and/or diagnosis of SARS-CoV-2 by FDA under an Emergency Use Authorization (EUA). This EUA will remain in effect (meaning this test can be used) for the duration of the COVID-19 declaration under Section 564(b)(1) of the Act, 21 U.S.C. section 360bbb-3(b)(1), unless the authorization is terminated or revoked.  Performed at [facility], [address], [place], [place] [address]   Surgical PCR screen     Status: None   Collection Time: 06/23/20  2:23 AM   Specimen: Nasal Mucosa; Nasal Swab  Result Value Ref Range Status   MRSA, PCR NEGATIVE NEGATIVE Final   Staphylococcus aureus NEGATIVE NEGATIVE Final    Comment: (NOTE) The Xpert SA Assay (FDA  approved for NASAL specimens in patients 74 years of age and older), is one component of a comprehensive surveillance program. It is not intended to diagnose infection nor to guide or monitor treatment. Performed at [facility], [address] [place], [place] [id number]      Radiology Studies: DG Chest 1 View  Result Date: 06/22/2020 CLINICAL DATA:  Fall, chest injury EXAM: CHEST  1 VIEW COMPARISON:  None. FINDINGS: Small to moderate left and tiny right pleural effusions are present. There is left basilar atelectasis, however, there is superimposed left mid and lower lung zone focal pulmonary infiltrate, possibly infectious or inflammatory in the acute setting. Trace superimposed interstitial pulmonary edema, likely cardiogenic in nature. Cardiac size is mildly enlarged. No acute  bone abnormality. IMPRESSION: Mild cardiogenic failure with bilateral pleural effusions and mild interstitial pulmonary edema. Mild cardiomegaly. Superimposed focal pulmonary infiltrate within the left mid and lower lung zone, possibly infectious or inflammatory in the acute setting. Electronically Signed   By: Helyn NumbersAshesh  Parikh MD   On: 06/22/2020 21:52   DG Pelvis Portable  Result Date: 06/23/2020 CLINICAL DATA:  Postop left hip fracture. EXAM: PORTABLE PELVIS 1-2 VIEWS COMPARISON:  06/22/2020 FINDINGS: Left hip fixation hardware in good position. Reduction of the intertrochanteric fracture of the left hip. The right hip is normally located. No pelvic fractures are identified. IMPRESSION: Internal fixation of the left hip fracture with good position/alignment. Electronically Signed   By: Rudie MeyerP.  Gallerani M.D.   On: 06/23/2020 15:15   DG Knee Complete 4 Views Left  Result Date: 06/22/2020 CLINICAL DATA:  Left knee pain EXAM: LEFT KNEE - COMPLETE 4+ VIEW COMPARISON:  None. FINDINGS: Four view radiograph left knee demonstrates normal alignment. No fracture or dislocation. Joint spaces are preserved. No effusion. There is extensive subcutaneous edema involving the lateral soft tissues of the distal thigh and proximal left foreleg. IMPRESSION: Extensive lateral soft tissue swelling.  No fracture or dislocation. Electronically Signed   By: Helyn NumbersAshesh  Parikh MD   On: 06/22/2020 21:54   DG HIP OPERATIVE UNILAT WITH PELVIS LEFT  Result Date: 06/23/2020 CLINICAL DATA:  Left hip fracture. EXAM: OPERATIVE left HIP (WITH PELVIS IF PERFORMED) 11 VIEWS TECHNIQUE: Fluoroscopic spot image(s) were submitted for interpretation post-operatively. COMPARISON:  Radiograph 06/22/2020 FINDINGS: Multiple fluoroscopic spot images demonstrate placement of a intramedullary gamma nail in the femur, a proximal dynamic hip screw and the single mid femur interlocking screw. Good position and alignment of the intertrochanteric fracture without  complicating features. IMPRESSION: Internal fixation of the intertrochanteric fracture. Electronically Signed   By: Rudie MeyerP.  Gallerani M.D.   On: 06/23/2020 15:16   DG Hip Unilat With Pelvis 2-3 Views Left  Result Date: 06/22/2020 CLINICAL DATA:  Fall, left hip pain EXAM: DG HIP (WITH OR WITHOUT PELVIS) 2-3V LEFT COMPARISON:  None FINDINGS: Single view radiograph pelvis and two view radiograph left hip demonstrates an acute, impacted basicervical femoral neck fracture. The fracture extends into the superior aspect of the lesser trochanter, however, this does not appear avulsed from the a distal fracture fragment consisting of the femoral shaft. Moderate varus and mild dorsal angulation. Femoral head is still seated within the left acetabulum. At least mild to moderate superimposed left hip degenerative arthritis with joint space narrowing. The pelvis and right hip are intact. Mild right hip degenerative arthritis. IMPRESSION: Acute, impacted, angulated basicervical left femoral neck fracture. Superimposed moderate left hip degenerative arthritis. Electronically Signed   By: Helyn NumbersAshesh  Parikh MD   On: 06/22/2020 21:49  DG FEMUR PORT MIN 2 VIEWS LEFT  Result Date: 06/23/2020 CLINICAL DATA:  Postop left hip fracture fixation. EXAM: LEFT FEMUR PORTABLE 2 VIEWS COMPARISON:  Radiographs 06/22/2020 FINDINGS: There is a long intramedullary gamma nail in the femur with 1 mid interlocking screw. There is also a proximal dynamic hip screw in good position. Good position and alignment of the intertrochanteric fracture. IMPRESSION: Internal fixation of intertrochanteric fracture. Good position and alignment. Electronically Signed   By: Rudie Meyer M.D.   On: 06/23/2020 15:13    Scheduled Meds:  ipratropium-albuterol  3 mL Nebulization Once   [MAR Hold] mupirocin ointment  1 application Nasal BID   [MAR Hold] senna-docusate  1 tablet Oral BID   Continuous Infusions:  sodium chloride 75 mL/hr at 06/23/20 1111   lactated  ringers 1,000 mL (06/23/20 1141)     LOS: 0 days   Time spent: 35 mins  Esabella Stockinger Laural Benes, MD How to contact the [facility] Attending or Consulting provider 7A - 7P or covering provider during after hours 7P -7A, for this patient?  Check the care team in [facility] and look for a) attending/consulting TR[facility] provider listed and b) the [facility] team listed Log into [email or web address] and use [place]'s universal password to access. If you do not have the password, please contact the hospital operator. Locate the [facility] provider you are looking for under [facility] and page to a number that you can be directly reached. If you still have difficulty reaching the provider, please page the [facility] (Director on Call) for the Hospitalists listed on amion for assistance.  06/23/2020, 4:30 PM

## 2020-06-23 NOTE — Op Note (Addendum)
Orthopaedic Surgery Operative Note (CSN: [id number])  Alicia Vaughn  08/30/1945 Date of Surgery: 06/22/2020 - 06/23/2020   Diagnoses:  Left basicervical femoral neck fracture  Procedure: Cephalomedullary nail for Left basicervical femoral neck fracture   Operative Finding Successful completion of the planned procedure.  We placed a nail that was 33 cm x 10 cm; 90 mm lag screw and a 38 mm distal interlocking screw   Post-Op Diagnosis: Same Surgeons:Primary: Oliver Barre, MD Assistants:  None Location: [facility] OR ROOM 4 Anesthesia: General with local anesthesia Antibiotics: Ancef 2 g Tourniquet time: N/A Estimated Blood Loss: 200 cc Complications: None Specimens: None Implants: Implant Name Type Inv. Item Serial No. Manufacturer Lot No. LRB No. Used Action  NAIL TROCH FEM ES [id number] - [id number]  NAIL TROCH FEM ES [id number]  [employer] STERILE ON SET FROM SPD Left 1 Implanted  SCREW SOLID LEFTY LAG 10.5X90 - [id number] Screw SCREW SOLID LEFTY LAG 10.5X90  [employer] STERILE ON SET FROM SPD Left 1 Implanted  SCREW LOCK CORT 5.0X38 - [id number] Screw SCREW LOCK CORT 5.0X38  [employer] STERILE ON SET FROM SPD Left 1 Implanted    Indications for Surgery:   Alicia Vaughn is a 75 y.o. female who had a mechanical fall and sustained a Left basicervical femoral neck fracture.  I recommended operative fixation to restore stability and allow the patient to ambulate immediately postop.  Benefits and risks of operative and nonoperative management were discussed prior to surgery with patient/guardian(s) and informed consent form was completed.  Specific risks including infection, need for additional surgery, persistent pain, bleeding, malunion, nonunion and more severe complications associated with anesthesia.  The patient elected to proceed and surgical consent was obtained.    Procedure:   The patient was identified properly. Informed consent was obtained and the surgical site was marked. The  patient was taken to the OR where general anesthesia was induced.   The patient was placed supine on a fracture table and an appropriate reduction was obtained and visualized on fluoroscopy prior to the beginning of the procedure.  Timeout was performed before the beginning of the case.  Ancef 2 g dosing was confirmed prior to making incision.  The patient received TXA prior to the start of surgery.   We made an incision proximal to the greater trochanter and dissected down through the fascia.  We then carefully placed a guidepin, localizing under fluoroscopy.  Once satisfied with the starting point, the entry reamer was used to gain entry into the intramedullary canal.  A ball tip guidewire was then introduced and passed to an appropriate level at the physeal scar of the distal femur and measurement was obtained proximally using fluoroscopy.  We selected the appropriate length of nail, as noted above.  Entry reamer was used needed but the nail was unreamed.  At this point we placed our nail was introduced, localizing under fluoroscopy, and we confirmed that it was at the appropriate level.  Next we used the outrigger device to pass a wire into the femoral neck, followed by the cephalomedullary lag screw.  We then turned our attention to the distal interlocking screw.  Once again, we used the outrigger device to place a single interlocking screw in the midshaft area.  The outrigger device was removed and final fluoroscopic images were obtained.  The wounds were thoroughly irrigated closed in a multilayer fashion with 0 vicryl, 2-0 monocryl and staples.  Sterile dressings were placed.  The patient was awoken from general  anesthesia and taken to the PACU in stable condition without complication.     Post-operative plan:  Weightbearing: The patient will be WBAT on the operative extremity.   DVT prophylaxis per primary team, no orthopedic contraindications.  Recommend 81 mg Aspirin BID, unless patient cannot  tolerate or was previously on anticoagulation.  Prefer to start Ppx POD#1 Pain control with PRN pain medication preferring oral medicines.   Dressing can be reinforced as needed, will change on POD#2/3 if needed.  Patient does not need to remain hospitalized for dressing change Follow up plan: approximately 2 weeks postop for incision check and XR.  If the patient will be discharged to a nursing facility, staples can be removed around this time and a follow up appointment can be scheduled for 6 weeks after surgery. XR at next visit:  please obtain AP pelvis, and 2 views of the Left hip

## 2020-06-23 NOTE — Anesthesia Procedure Notes (Signed)
Procedure Name: LMA Insertion Date/Time: 06/23/2020 11:59 AM Performed by: Brynda Peon, CRNA Pre-anesthesia Checklist: Patient identified, Emergency Drugs available, Suction available, Patient being monitored and Timeout performed Patient Re-evaluated:Patient Re-evaluated prior to induction Oxygen Delivery Method: Circle system utilized Preoxygenation: Pre-oxygenation with 100% oxygen Induction Type: IV induction LMA: LMA inserted LMA Size: 3.0 Number of attempts: 1 Placement Confirmation: positive ETCO2, CO2 detector and breath sounds checked- equal and bilateral Tube secured with: Tape Dental Injury: Teeth and Oropharynx as per pre-operative assessment

## 2020-06-23 NOTE — Progress Notes (Signed)
Initial Nutrition Assessment  DOCUMENTATION CODES:      INTERVENTION:  Pending diet advancment   NUTRITION DIAGNOSIS:   Increased nutrient needs related to hip fracture, post-op healing as evidenced by estimated needs.   GOAL:  Patient will meet greater than or equal to 90% of their needs   MONITOR:  Diet advancement, PO intake, Weight trends, Skin, Labs  REASON FOR ASSESSMENT:   Consult Hip fracture protocol  ASSESSMENT: Patient is a 75 yo female with left hip fracture following a mechanical fall. She is scheduled for surgery (cephalomedullary nail) today   Patient NPO at present. Patient is out of room for surgery. No family in room. Obtain additional nutrition history once pt has returned.   Reviewed available weight history.  Medications reviewed and include: Senokot  IVF-NS @75  ml/hr  Labs: BMP Latest Ref Rng & Units 06/23/2020 06/22/2020  Glucose 70 - 99 mg/dL 08/22/2020) 017(C)  BUN 8 - 23 mg/dL 944(H) 67(R)  Creatinine 0.44 - 1.00 mg/dL 91(M 3.84)  Sodium 6.65(L - 145 mmol/L 139 136  Potassium 3.5 - 5.1 mmol/L 3.6 3.9  Chloride 98 - 111 mmol/L 104 100  CO2 22 - 32 mmol/L 29 30  Calcium 8.9 - 10.3 mg/dL 935) 7.0(V)     NUTRITION - FOCUSED PHYSICAL EXAM: Unable to complete Nutrition-Focused physical exam at this time.    Diet Order:   Diet Order             Diet NPO time specified  Diet effective now                   EDUCATION NEEDS:  Not appropriate for education at this time  Skin:  Skin Assessment: Reviewed RN Assessment  Last BM:  PTA  Height:   Ht Readings from Last 1 Encounters:  06/22/20 4\' 11"  (1.499 m)    Weight:   Wt Readings from Last 1 Encounters:  06/22/20 59 kg    Ideal Body Weight:   45 kg  BMI:  Body mass index is 26.26 kg/m.  Estimated Nutritional Needs:   Kcal:  1700-1800  Protein:  77-85 gr  Fluid:  1.7-1.8 liters daily   MS,RD,CSG,LDN Contact: 08/22/20

## 2020-06-23 NOTE — Anesthesia Postprocedure Evaluation (Signed)
Anesthesia Post Note  Patient: Alicia Vaughn  Procedure(s) Performed: INTRAMEDULLARY (IM) NAIL INTERTROCHANTRIC (Left: Hip)  Patient location during evaluation: PACU Anesthesia Type: General Level of consciousness: awake and alert and oriented Pain management: pain level controlled Vital Signs Assessment: post-procedure vital signs reviewed and stable Respiratory status: spontaneous breathing and respiratory function stable Cardiovascular status: blood pressure returned to baseline and stable Postop Assessment: no apparent nausea or vomiting Anesthetic complications: no   No notable events documented.   Last Vitals:  Vitals:   06/23/20 1436 06/23/20 1445  BP: (!) 136/92 121/70  Pulse: 87 99  Resp: (!) 25 16  Temp:    SpO2: 95% 100%    Last Pain:  Vitals:   06/23/20 1415  TempSrc:   PainSc: Asleep                 Webber Michiels C Randie Tallarico

## 2020-06-23 NOTE — Transfer of Care (Signed)
Immediate Anesthesia Transfer of Care Note  Patient: Alicia Vaughn  Procedure(s) Performed: INTRAMEDULLARY (IM) NAIL INTERTROCHANTRIC (Left: Hip)  Patient Location: PACU  Anesthesia Type:General  Level of Consciousness: drowsy  Airway & Oxygen Therapy: Patient Spontanous Breathing and Patient connected to face mask oxygen  Post-op Assessment: Report given to RN and Post -op Vital signs reviewed and stable  Post vital signs: Reviewed and stable  Last Vitals:  Vitals Value Taken Time  BP 101/55 06/23/20 1416  Temp    Pulse 81 06/23/20 1420  Resp 14 06/23/20 1420  SpO2 100 % 06/23/20 1420  Vitals shown include unvalidated device data.  Last Pain:  Vitals:   06/23/20 1132  TempSrc: Oral  PainSc: 10-Worst pain ever      Patients Stated Pain Goal: 7 (06/23/20 1132)  Complications: No notable events documented.

## 2020-06-23 NOTE — Anesthesia Preprocedure Evaluation (Signed)
Anesthesia Evaluation  Patient identified by MRN, date of birth, ID band Patient awake    Reviewed: Allergy & Precautions, NPO status , Patient's Chart, lab work & pertinent test results  History of Anesthesia Complications Negative for: history of anesthetic complications  Airway Mallampati: II  TM Distance: >3 FB Neck ROM: Full    Dental  (+) Dental Advisory Given, Missing, Poor Dentition   Pulmonary COPD, former smoker,    Pulmonary exam normal breath sounds clear to auscultation       Cardiovascular negative cardio ROS Normal cardiovascular exam Rhythm:Regular Rate:Normal  22-Jun-2020 23:41:23 [facility]-ER ROUTINE RECORD [birth year] (75 yr) Female Caucasian Room:ER5 Loc:499 Technician: SS Test ind: Comment 1:: Comment 2:: Comment 3:: Comment 4:: Vent. rate 102 BPM PR interval 188 ms QRS duration 79 ms QT/QTcB 308/402 ms P-R-T axes 88 83 -7 Sinus tachycardia Borderline right axis deviation Borderline T wave abnormalities Nonspecific T wave abnormality No previous ECGs available Confirmed by Glynn Octave [phone]) on 06/22/2020 11:44:42 PM   Neuro/Psych negative neurological ROS  negative psych ROS   GI/Hepatic negative GI ROS, Neg liver ROS,   Endo/Other  negative endocrine ROS  Renal/GU negative Renal ROS     Musculoskeletal  (+) Arthritis  (back sx),   Abdominal   Peds  Hematology  (+) anemia ,   Anesthesia Other Findings   Reproductive/Obstetrics negative OB ROS                             Anesthesia Physical Anesthesia Plan  ASA: 3  Anesthesia Plan: General   Post-op Pain Management:    Induction: Intravenous  PONV Risk Score and Plan: 4 or greater and Ondansetron and Dexamethasone  Airway Management Planned: LMA  Additional Equipment:   Intra-op Plan:   Post-operative Plan: Extubation in OR  Informed Consent: I have reviewed the  patients History and Physical, chart, labs and discussed the procedure including the risks, benefits and alternatives for the proposed anesthesia with the patient or authorized representative who has indicated his/her understanding and acceptance.     Dental advisory given  Plan Discussed with: CRNA and Surgeon  Anesthesia Plan Comments: (Patient refused spinal anesthesia , as per patient, she  had rods and screws in her back.)        Anesthesia Quick Evaluation

## 2020-06-23 NOTE — Consult Note (Signed)
ORTHOPAEDIC CONSULTATION  REQUESTING PHYSICIAN: Murlean Iba, MD  ASSESSMENT AND PLAN: 75 y.o. female with the following: Left Hip Basicervical femoral neck fracture  This patient requires inpatient admission to the hospitalist, to include preoperative clearance and perioperative medical management  - Weight Bearing Status/Activity: NWB Left lower extremity  - Additional recommended labs/tests: Preop Labs: CBC, BMP, PT/INR, Chest XR, and EKG  -VTE Prophylaxis: Please hold prior to OR; to resume POD#1 at the discretion of the primary team  - Pain control: Recommend PO pain medications PRN; judicious use of narcotics  - Follow-up plan: F/u 10-14 days postop  -Procedures: Plan for OR once patient has been medically optimized  Plan for Left Cephalomedullary nail 06/23/2020  Patient has been NPO since midnight.  Risks and benefits of surgery, including, but not limited to infection, bleeding, persistent pain, damage to surrounding structures, need for further surgery, malunion, nonunion and more severe complications associated with anesthesia were discussed.  All questions have been answered and they have elected to proceed with surgery.  Surgical consent will be finalized prior to surgery.       Chief Complaint: Left hip pain  HPI: Alicia Vaughn is a 75 y.o. female who presented to the ED for evaluation after sustaining a mechanical fall.  She states she was loading a camper when she lost her balance and fell approximately 4 feet.  She landed directly on her left side.  She noted immediate pain in her left hip and inability to bear weight. She is complaining of left hip pain currently.  Pain worsens with movement.  Pain medications are not currently helping with her pain.  She has chronic lymphedema in bilateral lower extremities.   No numbness or tingling.   Past Medical History:  Diagnosis Date   COPD (chronic obstructive pulmonary disease) (HCC)    Past Surgical  History:  Procedure Laterality Date   BACK SURGERY     Social History   Socioeconomic History   Marital status: Married    Spouse name: Not on file   Number of children: Not on file   Years of education: Not on file   Highest education level: Not on file  Occupational History   Not on file  Tobacco Use   Smoking status: Former    Pack years: 0.00    Types: Cigarettes   Smokeless tobacco: Never  Vaping Use   Vaping Use: Never used  Substance and Sexual Activity   Alcohol use: Not Currently   Drug use: Never   Sexual activity: Not on file  Other Topics Concern   Not on file  Social History Narrative   Not on file   Social Determinants of Health   Financial Resource Strain: Not on file  Food Insecurity: Not on file  Transportation Needs: Not on file  Physical Activity: Not on file  Stress: Not on file  Social Connections: Not on file   Family History  Problem Relation Age of Onset   Lymphoma Brother    Lung cancer Brother    Allergies  Allergen Reactions   Azithromycin     Allergic to all MYCINS   Vancomycin    Prior to Admission medications   Not on File   DG Chest 1 View  Result Date: 06/22/2020 CLINICAL DATA:  Fall, chest injury EXAM: CHEST  1 VIEW COMPARISON:  None. FINDINGS: Small to moderate left and tiny right pleural effusions are present. There is left basilar atelectasis, however, there is superimposed left mid  and lower lung zone focal pulmonary infiltrate, possibly infectious or inflammatory in the acute setting. Trace superimposed interstitial pulmonary edema, likely cardiogenic in nature. Cardiac size is mildly enlarged. No acute bone abnormality. IMPRESSION: Mild cardiogenic failure with bilateral pleural effusions and mild interstitial pulmonary edema. Mild cardiomegaly. Superimposed focal pulmonary infiltrate within the left mid and lower lung zone, possibly infectious or inflammatory in the acute setting. Electronically Signed   By: Fidela Salisbury  MD   On: 06/22/2020 21:52   DG Knee Complete 4 Views Left  Result Date: 06/22/2020 CLINICAL DATA:  Left knee pain EXAM: LEFT KNEE - COMPLETE 4+ VIEW COMPARISON:  None. FINDINGS: Four view radiograph left knee demonstrates normal alignment. No fracture or dislocation. Joint spaces are preserved. No effusion. There is extensive subcutaneous edema involving the lateral soft tissues of the distal thigh and proximal left foreleg. IMPRESSION: Extensive lateral soft tissue swelling.  No fracture or dislocation. Electronically Signed   By: Fidela Salisbury MD   On: 06/22/2020 21:54   DG Hip Unilat With Pelvis 2-3 Views Left  Result Date: 06/22/2020 CLINICAL DATA:  Fall, left hip pain EXAM: DG HIP (WITH OR WITHOUT PELVIS) 2-3V LEFT COMPARISON:  None FINDINGS: Single view radiograph pelvis and two view radiograph left hip demonstrates an acute, impacted basicervical femoral neck fracture. The fracture extends into the superior aspect of the lesser trochanter, however, this does not appear avulsed from the a distal fracture fragment consisting of the femoral shaft. Moderate varus and mild dorsal angulation. Femoral head is still seated within the left acetabulum. At least mild to moderate superimposed left hip degenerative arthritis with joint space narrowing. The pelvis and right hip are intact. Mild right hip degenerative arthritis. IMPRESSION: Acute, impacted, angulated basicervical left femoral neck fracture. Superimposed moderate left hip degenerative arthritis. Electronically Signed   By: Fidela Salisbury MD   On: 06/22/2020 21:49    Family History Reviewed and non-contributory, no pertinent history of problems with bleeding or anesthesia    Review of Systems No fevers or chills No numbness or tingling No chest pain No shortness of breath No bowel or bladder dysfunction No GI distress No headaches    OBJECTIVE  Vitals:Patient Vitals for the past 8 hrs:  BP Temp Temp src Pulse Resp SpO2  06/23/20  0741 103/62 98.3 F (36.8 C) Oral 98 20 95 %  06/23/20 0421 (!) 109/56 98.2 F (36.8 C) -- 96 15 100 %   General: Alert, answering questions.  Currently in some discomfort.  Cardiovascular: Extremities are warm Respiratory: No cyanosis, no use of accessory musculature Skin: No lesions in the area of chief complaint  Neurologic: Sensation intact distally  Psychiatric: Patient is competent for consent with normal mood and affect Lymphatic: Bilateral lower extremity swelling to her knees.  This is chronic. LLE: Extremity held in a fixed position.  ROM deferred due to known fracture.  Sensation is intact distally in the sural, saphenous, DP, SP, and plantar nerve distribution. Toes are WWP.  Active motion intact in the TA/EHL/GS.  Pitting edema to knee RLE: Sensation is intact distally in the sural, saphenous, DP, SP, and plantar nerve distribution. Toes are WWP.  Active motion intact in the TA/EHL/GS. Tolerates gentle ROM of the hip.  No pain with axial loading.  Pitting edema to knee.     Test Results Imaging XR of the Left hip demonstrates a Basicervical femoral neck fracture.  Mild impaction at the fracture site. There is also some moderate degenerative changes and  loss of joint space.   Labs cbc Recent Labs    06/22/20 2048 06/23/20 0608  WBC 8.5 11.7*  HGB 11.9* 11.8*  HCT 38.0 38.3  PLT 368 358    Labs inflam No results for input(s): CRP in the last 72 hours.  Invalid input(s): ESR  Labs coag Recent Labs    06/22/20 2048  INR 1.0    Recent Labs    06/22/20 2048 06/23/20 0608  NA 136 139  K 3.9 3.6  CL 100 104  CO2 30 29  GLUCOSE 110* 119*  BUN 26* 25*  CREATININE 1.17* 0.83  CALCIUM 8.4* 8.7*

## 2020-06-24 ENCOUNTER — Inpatient Hospital Stay (HOSPITAL_COMMUNITY): Payer: Medicare Other

## 2020-06-24 DIAGNOSIS — I517 Cardiomegaly: Secondary | ICD-10-CM

## 2020-06-24 LAB — BASIC METABOLIC PANEL
Anion gap: 5 (ref 5–15)
BUN: 20 mg/dL (ref 8–23)
CO2: 28 mmol/L (ref 22–32)
Calcium: 8.5 mg/dL — ABNORMAL LOW (ref 8.9–10.3)
Chloride: 106 mmol/L (ref 98–111)
Creatinine, Ser: 0.79 mg/dL (ref 0.44–1.00)
GFR, Estimated: >60 mL/min (ref >60)
Glucose, Bld: 130 mg/dL — ABNORMAL HIGH (ref 70–99)
Potassium: 4.4 mmol/L (ref 3.5–5.1)
Sodium: 139 mmol/L (ref 135–145)

## 2020-06-24 LAB — ECHOCARDIOGRAM COMPLETE
Area-P 1/2: 6.6 cm2
Height: 59 in
S' Lateral: 2.16 cm
Single Plane A4C EF: 72.5 %
Weight: 2080 oz

## 2020-06-24 LAB — CBC
HCT: 35.3 % — ABNORMAL LOW (ref 36.0–46.0)
Hemoglobin: 10.5 g/dL — ABNORMAL LOW (ref 12.0–15.0)
MCH: 28.9 pg (ref 26.0–34.0)
MCHC: 29.7 g/dL — ABNORMAL LOW (ref 30.0–36.0)
MCV: 97.2 fL (ref 80.0–100.0)
Platelets: 293 10*3/uL (ref 150–400)
RBC: 3.63 MIL/uL — ABNORMAL LOW (ref 3.87–5.11)
RDW: 14.4 % (ref 11.5–15.5)
WBC: 14.2 10*3/uL — ABNORMAL HIGH (ref 4.0–10.5)
nRBC: 0 % (ref 0.0–0.2)

## 2020-06-24 LAB — GLUCOSE, CAPILLARY
Glucose-Capillary: 111 mg/dL — ABNORMAL HIGH (ref 70–99)
Glucose-Capillary: 104 mg/dL — ABNORMAL HIGH (ref 70–99)
Glucose-Capillary: 106 mg/dL — ABNORMAL HIGH (ref 70–99)
Glucose-Capillary: 122 mg/dL — ABNORMAL HIGH (ref 70–99)

## 2020-06-24 MED ORDER — SPIRONOLACTONE 25 MG PO TABS
200.0000 mg | ORAL_TABLET | Freq: Every day | ORAL | Status: DC
  Administered 2020-06-24 – 2020-06-29 (×6): 200 mg via ORAL
  Filled 2020-06-24 (×7): qty 8

## 2020-06-24 MED ORDER — ASPIRIN EC 81 MG PO TBEC
81.0000 mg | DELAYED_RELEASE_TABLET | Freq: Two times a day (BID) | ORAL | Status: DC
  Administered 2020-06-24 – 2020-06-28 (×9): 81 mg via ORAL
  Filled 2020-06-24 (×9): qty 1

## 2020-06-24 MED ORDER — IPRATROPIUM-ALBUTEROL 0.5-2.5 (3) MG/3ML IN SOLN
3.0000 mL | Freq: Four times a day (QID) | RESPIRATORY_TRACT | Status: DC | PRN
  Administered 2020-06-25 – 2020-06-26 (×2): 3 mL via RESPIRATORY_TRACT
  Filled 2020-06-24 (×2): qty 3

## 2020-06-24 MED ORDER — BUMETANIDE 1 MG PO TABS
1.0000 mg | ORAL_TABLET | Freq: Two times a day (BID) | ORAL | Status: DC
  Administered 2020-06-24 – 2020-06-28 (×8): 1 mg via ORAL
  Filled 2020-06-24 (×8): qty 1

## 2020-06-24 NOTE — Progress Notes (Addendum)
PROGRESS NOTE   Alicia Vaughn  DTO:[id number] DOB: 10-10-1945 DOA: 06/22/2020 PCP: Veverly Fells, MD   Chief Complaint  Patient presents with   Fall   Level of care: Telemetry  Brief Admission History:  75 y.o. female with medical history significant of COPD, unspecified liver cirrhosis, stage II lymphedema who is coming to the emergency department due to having an accidental fall from a truck at home landing on her left hip area resulting in immediate pain and inability to stand or bear weight on her LLE.  She was admitted for operative repair.    Assessment & Plan:   Principal Problem:   Closed left hip fracture, initial encounter (HCC) Active Problems:   Normocytic anemia   COPD (chronic obstructive pulmonary disease) (HCC)   Decreased GFR   Bilateral lower extremity edema   Mild protein malnutrition (HCC)   Cardiomegaly  Acute left femur fracture s/p repair POD#1 s/p cephalomedullary nail for left femoral neck fracture.  Postop mgmt per orthopedics team. Aspirin 81 mg BID started for DVT prophylaxis.  PT recommending SNF.     Normocytic anemia-stable anticipate some postop anemia and will follow closely.  Leukocytosis - likely reactive from hip fracture and surgery no s/s of infection found.  Follow.   COPD-stable, bronchodilators as ordered.  Chronic Lymphedema bilateral lower extremities - elevated legs, TED hose ordered. Restarted home diuretics.   Mild protein calorie malnutrition-dietitian consultation as part of hip surgery work-up  Cardiomegaly-2D echocardiogram ordered  IMPRESSIONS   1. Left ventricular ejection fraction, by estimation, is 60 to 65%. The left ventricle has normal function. The left ventricle has no regional wall motion abnormalities. Left ventricular diastolic parameters were normal.   2. Right ventricular systolic function is normal. The right ventricular size is normal. There is normal pulmonary artery systolic pressure.   3. Moderate pleural  effusion in the left lateral region.   4. The mitral valve is abnormal. Trivial mitral valve regurgitation. No evidence of mitral stenosis.   5. The aortic valve is tricuspid. There is mild calcification of the aortic valve. Aortic valve regurgitation is not visualized. Mild aortic valve sclerosis is present, with no evidence of aortic valve stenosis.   6. The inferior vena cava is normal in size with greater than 50% respiratory variability, suggesting right atrial pressure of 3 mmHg.   DVT prophylaxis: aspirin/SCDs Code Status: Full Family Communication: Patient updated with plan of care at bedside Disposition: To be determined Status is: Inpatient  Remains inpatient appropriate because:Ongoing active pain requiring inpatient pain management  Dispo: The patient is from: Home              Anticipated d/c is to: SNF              Patient currently is not medically stable to d/c.   Difficult to place patient No   Consultants:  Dr. Dallas Schimke orthopedics  Procedures:  Cephalomedullary nail for left femoral neck fracture 06/23/20  Antimicrobials:     Subjective: Pt reports that the pain and discomfort is much better after surgery.     Objective: Vitals:   06/23/20 1943 06/24/20 0400 06/24/20 0716 06/24/20 1341  BP: 105/72 99/62 (!) 97/55 97/61  Pulse: 99 (!) 107 92 87  Resp: 18 18 16 16   Temp: 98.3 F (36.8 C) 99.3 F (37.4 C) 98.5 F (36.9 C)   TempSrc: Oral Oral Oral   SpO2: 92% 92% 98% 100%  Weight:      Height:  Intake/Output Summary (Last 24 hours) at 06/24/2020 1419 Last data filed at 06/24/2020 1300 Gross per 24 hour  Intake 1157.4 ml  Output 400 ml  Net 757.4 ml   Filed Weights   06/22/20 2033  Weight: 59 kg    Examination:  General exam: elderly female, awake, oriented x 3, Appears calm and comfortable  Respiratory system: Clear to auscultation. Respiratory effort normal. Cardiovascular system: normal S1 & S2 heard. No JVD, murmurs, rubs, gallops or  clicks. No pedal edema. Gastrointestinal system: Abdomen is nondistended, soft and nontender. No organomegaly or masses felt. Normal bowel sounds heard. Central nervous system: Alert and oriented. No focal neurological deficits. Extremities: wound clean and dry.  Good pedal pulses BLEs.  Skin: No rashes, lesions or ulcers Psychiatry: Judgement and insight appear normal. Mood & affect appropriate.   Data Reviewed: I have personally reviewed following labs and imaging studies  CBC: Recent Labs  Lab 06/22/20 2048 06/23/20 0608 06/24/20 0429  WBC 8.5 11.7* 14.2*  NEUTROABS 6.5  --   --   HGB 11.9* 11.8* 10.5*  HCT 38.0 38.3 35.3*  MCV 95.0 95.5 97.2  PLT 368 358 293    Basic Metabolic Panel: Recent Labs  Lab 06/22/20 2048 06/23/20 0608 06/24/20 0429  NA 136 139 139  K 3.9 3.6 4.4  CL 100 104 106  CO2 30 29 28   GLUCOSE 110* 119* 130*  BUN 26* 25* 20  CREATININE 1.17* 0.83 0.79  CALCIUM 8.4* 8.7* 8.5*    GFR: Estimated Creatinine Clearance: 48.2 mL/min (by C-G formula based on SCr of 0.79 mg/dL).  Liver Function Tests: Recent Labs  Lab 06/22/20 2048 06/23/20 0608  AST 16 15  ALT 13 14  ALKPHOS 70 68  BILITOT 0.4 0.4  PROT 6.3* 5.9*  ALBUMIN 3.2* 3.1*    CBG: Recent Labs  Lab 06/23/20 0728 06/24/20 0723 06/24/20 1106  GLUCAP 115* 111* 104*    Recent Results (from the past 240 hour(s))  Resp Panel by RT-PCR (Flu A&B, Covid) Nasopharyngeal Swab     Status: None   Collection Time: 06/22/20  9:39 PM   Specimen: Nasopharyngeal Swab; Nasopharyngeal(NP) swabs in vial transport medium  Result Value Ref Range Status   SARS Coronavirus 2 by RT PCR NEGATIVE NEGATIVE Final    Comment: (NOTE) SARS-CoV-2 target nucleic acids are NOT DETECTED.  The SARS-CoV-2 RNA is generally detectable in upper respiratory specimens during the acute phase of infection. The lowest concentration of SARS-CoV-2 viral copies this assay can detect is 138 copies/mL. A negative result  does not preclude SARS-Cov-2 infection and should not be used as the sole basis for treatment or other patient management decisions. A negative result may occur with  improper specimen collection/handling, submission of specimen other than nasopharyngeal swab, presence of viral mutation(s) within the areas targeted by this assay, and inadequate number of viral copies(<138 copies/mL). A negative result must be combined with clinical observations, patient history, and epidemiological information. The expected result is Negative.  Fact Sheet for Patients:  BloggerCourse.comhttps://www.fda.gov/media/152166/download  Fact Sheet for Healthcare Providers:  SeriousBroker.ithttps://www.fda.gov/media/152162/download  This test is no t yet approved or cleared by the [place] FDA and  has been authorized for detection and/or diagnosis of SARS-CoV-2 by FDA under an Emergency Use Authorization (EUA). This EUA will remain  in effect (meaning this test can be used) for the duration of the COVID-19 declaration under Section 564(b)(1) of the Act, 21 U.S.C.section 360bbb-3(b)(1), unless the authorization is terminated  or revoked sooner.  Influenza A by PCR NEGATIVE NEGATIVE Final   Influenza B by PCR NEGATIVE NEGATIVE Final    Comment: (NOTE) The Xpert Xpress SARS-CoV-2/FLU/RSV plus assay is intended as an aid in the diagnosis of influenza from Nasopharyngeal swab specimens and should not be used as a sole basis for treatment. Nasal washings and aspirates are unacceptable for Xpert Xpress SARS-CoV-2/FLU/RSV testing.  Fact Sheet for Patients: BloggerCourse.com  Fact Sheet for Healthcare Providers: SeriousBroker.it  This test is not yet approved or cleared by the [place] FDA and has been authorized for detection and/or diagnosis of SARS-CoV-2 by FDA under an Emergency Use Authorization (EUA). This EUA will remain in effect (meaning this test can be used) for  the duration of the COVID-19 declaration under Section 564(b)(1) of the Act, 21 U.S.C. section 360bbb-3(b)(1), unless the authorization is terminated or revoked.  Performed at [facility], [address] [place], [place] [address]   Surgical PCR screen     Status: None   Collection Time: 06/23/20  2:23 AM   Specimen: Nasal Mucosa; Nasal Swab  Result Value Ref Range Status   MRSA, PCR NEGATIVE NEGATIVE Final   Staphylococcus aureus NEGATIVE NEGATIVE Final    Comment: (NOTE) The Xpert SA Assay (FDA approved for NASAL specimens in patients 73 years of age and older), is one component of a comprehensive surveillance program. It is not intended to diagnose infection nor to guide or monitor treatment. Performed at [facility], [address], [place], [place] [address]      Radiology Studies: DG Chest 1 View  Result Date: 06/22/2020 CLINICAL DATA:  Fall, chest injury EXAM: CHEST  1 VIEW COMPARISON:  None. FINDINGS: Small to moderate left and tiny right pleural effusions are present. There is left basilar atelectasis, however, there is superimposed left mid and lower lung zone focal pulmonary infiltrate, possibly infectious or inflammatory in the acute setting. Trace superimposed interstitial pulmonary edema, likely cardiogenic in nature. Cardiac size is mildly enlarged. No acute bone abnormality. IMPRESSION: Mild cardiogenic failure with bilateral pleural effusions and mild interstitial pulmonary edema. Mild cardiomegaly. Superimposed focal pulmonary infiltrate within the left mid and lower lung zone, possibly infectious or inflammatory in the acute setting. Electronically Signed   By: Helyn Numbers MD   On: 06/22/2020 21:52   DG Pelvis Portable  Result Date: 06/23/2020 CLINICAL DATA:  Postop left hip fracture. EXAM: PORTABLE PELVIS 1-2 VIEWS COMPARISON:  06/22/2020 FINDINGS: Left hip fixation hardware in good position. Reduction of the intertrochanteric fracture of the left hip. The right  hip is normally located. No pelvic fractures are identified. IMPRESSION: Internal fixation of the left hip fracture with good position/alignment. Electronically Signed   By: Rudie Meyer M.D.   On: 06/23/2020 15:15   DG CHEST PORT 1 VIEW  Result Date: 06/24/2020 CLINICAL DATA:  Cough. EXAM: PORTABLE CHEST 1 VIEW COMPARISON:  Two days ago FINDINGS: Hazy lower chest opacity with bilateral small to moderate pleural effusion. There has been increased pleural fluid on the right at least. Cardiomegaly which is partially obscured. No pneumothorax. Large lung volumes with known COPD. IMPRESSION: Increased pleural fluid and lower lobe opacity. Electronically Signed   By: Marnee Spring M.D.   On: 06/24/2020 08:08   DG Knee Complete 4 Views Left  Result Date: 06/22/2020 CLINICAL DATA:  Left knee pain EXAM: LEFT KNEE - COMPLETE 4+ VIEW COMPARISON:  None. FINDINGS: Four view radiograph left knee demonstrates normal alignment. No fracture or dislocation. Joint spaces are preserved. No effusion. There  is extensive subcutaneous edema involving the lateral soft tissues of the distal thigh and proximal left foreleg. IMPRESSION: Extensive lateral soft tissue swelling.  No fracture or dislocation. Electronically Signed   By: Helyn Numbers MD   On: 06/22/2020 21:54   ECHOCARDIOGRAM COMPLETE  Result Date: 06/24/2020    ECHOCARDIOGRAM REPORT   Patient Name:   WANDALENE ABRAMS Date of Exam: 06/24/2020 Medical Rec #:  [id number]     Height:       59.0 in Accession #:    [id number]    Weight:       130.0 lb Date of Birth:  02/01/1945      BSA:          1.536 m Patient Age:    75 years      BP:           97/55 mmHg Patient Gender: F             HR:           92 bpm. [facility] Procedure: 2D Echo, Cardiac Doppler and Color Doppler Indications:    Cardiomegaly  History:        Patient has no prior history of Echocardiogram examinations.                 COPD; Risk Factors:Former Smoker. Post-op Left hip                  fracture,pleural effusion.  Sonographer:    Lavenia Atlas RDCS Referring Phys: [id number] MARK A CAIRNS IMPRESSIONS  1. Left ventricular ejection fraction, by estimation, is 60 to 65%. The left ventricle has normal function. The left ventricle has no regional wall motion abnormalities. Left ventricular diastolic parameters were normal.  2. Right ventricular systolic function is normal. The right ventricular size is normal. There is normal pulmonary artery systolic pressure.  3. Moderate pleural effusion in the left lateral region.  4. The mitral valve is abnormal. Trivial mitral valve regurgitation. No evidence of mitral stenosis.  5. The aortic valve is tricuspid. There is mild calcification of the aortic valve. Aortic valve regurgitation is not visualized. Mild aortic valve sclerosis is present, with no evidence of aortic valve stenosis.  6. The inferior vena cava is normal in size with greater than 50% respiratory variability, suggesting right atrial pressure of 3 mmHg. FINDINGS  Left Ventricle: Left ventricular ejection fraction, by estimation, is 60 to 65%. The left ventricle has normal function. The left ventricle has no regional wall motion abnormalities. The left ventricular internal cavity size was normal in size. There is  no left ventricular hypertrophy. Left ventricular diastolic parameters were normal. Right Ventricle: The right ventricular size is normal. No increase in right ventricular wall thickness. Right ventricular systolic function is normal. There is normal pulmonary artery systolic pressure. The tricuspid regurgitant velocity is 2.81 m/s, and  with an assumed right atrial pressure of 3 mmHg, the estimated right ventricular systolic pressure is 34.6 mmHg. Left Atrium: Left atrial size was normal in size. Right Atrium: Right atrial size was normal in size. Pericardium: There is no evidence of pericardial effusion. Mitral Valve: The mitral valve is abnormal. There is mild thickening of the  mitral valve leaflet(s). Trivial mitral valve regurgitation. No evidence of mitral valve stenosis. Tricuspid Valve: The tricuspid valve is normal in structure. Tricuspid valve regurgitation is mild . No evidence of tricuspid stenosis. Aortic Valve: The aortic valve is tricuspid. There is mild calcification of the  aortic valve. Aortic valve regurgitation is not visualized. Mild aortic valve sclerosis is present, with no evidence of aortic valve stenosis. Pulmonic Valve: The pulmonic valve was normal in structure. Pulmonic valve regurgitation is not visualized. No evidence of pulmonic stenosis. Aorta: The aortic root is normal in size and structure. Venous: The inferior vena cava is normal in size with greater than 50% respiratory variability, suggesting right atrial pressure of 3 mmHg. IAS/Shunts: No atrial level shunt detected by color flow Doppler. Additional Comments: There is a moderate pleural effusion in the left lateral region.  LEFT VENTRICLE PLAX 2D LVIDd:         3.84 cm     Diastology LVIDs:         2.16 cm     LV e' medial:    10.10 cm/s LV PW:         0.93 cm     LV E/e' medial:  8.8 LV IVS:        0.83 cm     LV e' lateral:   10.90 cm/s LVOT diam:     2.10 cm     LV E/e' lateral: 8.1 LV SV:         91 LV SV Index:   59 LVOT Area:     3.46 cm  LV Volumes (MOD) LV vol d, MOD A4C: 47.2 ml LV vol s, MOD A4C: 13.0 ml LV SV MOD A4C:     47.2 ml RIGHT VENTRICLE RV Basal diam:  3.37 cm RV S prime:     8.59 cm/s TAPSE (M-mode): 2.2 cm LEFT ATRIUM             Index       RIGHT ATRIUM           Index LA diam:        2.70 cm 1.76 cm/m  RA Area:     12.20 cm LA Vol (A2C):   33.7 ml 21.94 ml/m RA Volume:   28.40 ml  18.49 ml/m LA Vol (A4C):   39.1 ml 25.46 ml/m LA Biplane Vol: 37.3 ml 24.29 ml/m  AORTIC VALVE LVOT Vmax:   137.00 cm/s LVOT Vmean:  90.400 cm/s LVOT VTI:    0.262 m  AORTA Ao Root diam: 2.80 cm MITRAL VALVE                TRICUSPID VALVE MV Area (PHT): 6.60 cm     TR Peak grad:   31.6 mmHg MV  Decel Time: 115 msec     TR Vmax:        281.00 cm/s MV E velocity: 88.50 cm/s MV A velocity: 107.00 cm/s  SHUNTS MV E/A ratio:  0.83         Systemic VTI:  0.26 m                             Systemic Diam: 2.10 cm Charlton Haws MD Electronically signed by Charlton Haws MD Signature Date/Time: 06/24/2020/11:43:23 AM    Final    DG HIP OPERATIVE UNILAT WITH PELVIS LEFT  Result Date: 06/23/2020 CLINICAL DATA:  Left hip fracture. EXAM: OPERATIVE left HIP (WITH PELVIS IF PERFORMED) 11 VIEWS TECHNIQUE: Fluoroscopic spot image(s) were submitted for interpretation post-operatively. COMPARISON:  Radiograph 06/22/2020 FINDINGS: Multiple fluoroscopic spot images demonstrate placement of a intramedullary gamma nail in the femur, a proximal dynamic hip screw and the single mid femur interlocking screw. Good position and alignment  of the intertrochanteric fracture without complicating features. IMPRESSION: Internal fixation of the intertrochanteric fracture. Electronically Signed   By: Rudie Meyer M.D.   On: 06/23/2020 15:16   DG Hip Unilat With Pelvis 2-3 Views Left  Result Date: 06/22/2020 CLINICAL DATA:  Fall, left hip pain EXAM: DG HIP (WITH OR WITHOUT PELVIS) 2-3V LEFT COMPARISON:  None FINDINGS: Single view radiograph pelvis and two view radiograph left hip demonstrates an acute, impacted basicervical femoral neck fracture. The fracture extends into the superior aspect of the lesser trochanter, however, this does not appear avulsed from the a distal fracture fragment consisting of the femoral shaft. Moderate varus and mild dorsal angulation. Femoral head is still seated within the left acetabulum. At least mild to moderate superimposed left hip degenerative arthritis with joint space narrowing. The pelvis and right hip are intact. Mild right hip degenerative arthritis. IMPRESSION: Acute, impacted, angulated basicervical left femoral neck fracture. Superimposed moderate left hip degenerative arthritis. Electronically  Signed   By: Helyn Numbers MD   On: 06/22/2020 21:49   DG FEMUR PORT MIN 2 VIEWS LEFT  Result Date: 06/23/2020 CLINICAL DATA:  Postop left hip fracture fixation. EXAM: LEFT FEMUR PORTABLE 2 VIEWS COMPARISON:  Radiographs 06/22/2020 FINDINGS: There is a long intramedullary gamma nail in the femur with 1 mid interlocking screw. There is also a proximal dynamic hip screw in good position. Good position and alignment of the intertrochanteric fracture. IMPRESSION: Internal fixation of intertrochanteric fracture. Good position and alignment. Electronically Signed   By: Rudie Meyer M.D.   On: 06/23/2020 15:13    Scheduled Meds:  aspirin EC  81 mg Oral BID   bumetanide  1 mg Oral BID   Chlorhexidine Gluconate Cloth  6 each Topical Daily   mupirocin ointment  1 application Nasal BID   senna-docusate  1 tablet Oral BID   spironolactone  200 mg Oral Daily   Continuous Infusions:  sodium chloride 10 mL/hr at 06/24/20 1038    ceFAZolin (ANCEF) IV 1 g (06/24/20 0516)    LOS: 1 day   Time spent: 35 mins  Myrissa Chipley Laural Benes, MD How to contact the [facility] Attending or Consulting provider 7A - 7P or covering provider during after hours 7P -7A, for this patient?  Check the care team in [facility] and look for a) attending/consulting [facility] provider listed and b) the [facility] team listed Log into [email or web address] and use [facility]'s universal password to access. If you do not have the password, please contact the hospital operator. Locate the [facility] provider you are looking for under [facility] and page to a number that you can be directly reached. If you still have difficulty reaching the provider, please page the [facility] (Director on Call) for the Hospitalists listed on amion for assistance.  06/24/2020, 2:19 PM

## 2020-06-24 NOTE — Progress Notes (Signed)
   ORTHOPAEDIC PROGRESS NOTE  s/p Procedure(s): CMN for left basicervical femoral neck fracture  DOS: 06/24/20  SUBJECTIVE: No issues over night.  Feels much better this morning, compared to yesterday.  She notes some pain with movement, but otherwise feels better.  She is drinking well, but did not have an appetite last night  OBJECTIVE: PE:  Alert and oriented, no acute distress  Left hip Lateral hip dressings are clean, dry and intact Chronic lymphedema with no acute worsening Active motion intact to TA/EHL Sensation intact to dorsum of foot Toes are warm and well perfused  Vitals:   06/24/20 0400 06/24/20 0716  BP: 99/62 (!) 97/55  Pulse: (!) 107 92  Resp: 18 16  Temp: 99.3 F (37.4 C) 98.5 F (36.9 C)  SpO2: 92% 98%   XR of the left hip demonstrates reduced fracture with hardware in good position.  No acute injuries  ASSESSMENT: Alicia Vaughn is a 75 y.o. female doing well postoperatively.  PLAN: Weightbearing: WBAT LLE Insicional and dressing care: Reinforce dressings as needed Orthopedic device(s): None VTE prophylaxis: Ok to resume POD#1; no orthopaedic contraindications.  Recommend 81 mg aspirin BID x 6 weeks Pain control: PO pain medications, PRN; judicious use of narcotics Follow - up plan: 2 weeks   Contact information:     Takera Rayl A. Dallas Schimke, MD MS [facility] [address] [place],  [place]  [address] Phone: [phone] Fax: [phone]

## 2020-06-24 NOTE — Evaluation (Signed)
Physical Therapy Evaluation Patient Details Name: Alicia Vaughn MRN: [id number] DOB: July 05, 1945 Today's Date: 06/24/2020   History of Present Illness  Alicia Vaughn is a 75 y.o. female s/p ORIF Left hip fracture on 06/23/20  with medical history significant of COPD, unspecified liver cirrhosis, stage II lymphedema who is coming to the emergency department due to having an accidental fall from a truck at home landing on her left hip area resulting in immediate pain and inability to stand or bear weight on her LLE.  She denies any type of prodromal symptoms.  She had another fall about a month ago due to lightheadedness, but did not sustain any injuries.  However, she denies chest pain, dizziness, dyspnea, diaphoresis, PND or orthopnea.  She gets frequent lower extremity edema.  She denies abdominal pain, nausea, vomiting, diarrhea, constipation, melena or hematochezia.  No flank pain, dysuria, frequency or hematuria.  Denies polyuria, polydipsia, polyphagia or blurred vision.   Clinical Impression  Patient demonstrates slow labored movement for sitting up at bedside with c/o severe BLE pain with any pressure or movement, at high risk for falls and limited to a few side steps at bedside before having to sit due to increasing left hip pain and c/o fatigue.  Patient tolerated sitting up in chair for 1 hour before put back to bed.  Patient will benefit from continued physical therapy in hospital and recommended venue below to increase strength, balance, endurance for safe ADLs and gait.      Follow Up Recommendations SNF    Equipment Recommendations  None recommended by PT    Recommendations for Other Services       Precautions / Restrictions Precautions Precautions: Fall Restrictions Weight Bearing Restrictions: Yes LLE Weight Bearing: Weight bearing as tolerated      Mobility  Bed Mobility Overal bed mobility: Needs Assistance Bed Mobility: Supine to Sit;Sit to Supine     Supine to  sit: Max assist Sit to supine: Max assist   General bed mobility comments: increased time, labored movement, c/o severe pain BLE with pressure, movement    Transfers Overall transfer level: Needs assistance Equipment used: Rolling walker (2 wheeled) Transfers: Sit to/from [employer] Sit to Stand: Mod assist Stand pivot transfers: Mod assist       General transfer comment: slow labored movement with difficulty advancing LLE due to increased pain  Ambulation/Gait Ambulation/Gait assistance: Mod assist;Max assist Gait Distance (Feet): 4 Feet Assistive device: Rolling walker (2 wheeled) Gait Pattern/deviations: Decreased step length - right;Decreased step length - left;Decreased stride length;Antalgic;Shuffle Gait velocity: slow   General Gait Details: limited to 4-5 slow labored unsteady steps at bedside with mostly shuffling of LLE due to c/o severe hip pain with movement  Stairs            Wheelchair Mobility    Modified Rankin (Stroke Patients Only)       Balance Overall balance assessment: Needs assistance Sitting-balance support: Feet supported;No upper extremity supported Sitting balance-Leahy Scale: Fair Sitting balance - Comments: fair/good seated at EOB   Standing balance support: During functional activity;Bilateral upper extremity supported Standing balance-Leahy Scale: Poor Standing balance comment: using RW                             Pertinent Vitals/Pain Pain Assessment: 0-10 Pain Score: 7  Pain Location: left hip with movement and pressure to BLE due to lymphadema/swelling Pain Descriptors / Indicators: Discomfort;Grimacing;Sharp;Sore;Guarding Pain Intervention(s): Limited activity within  patient's tolerance;Monitored during session;Repositioned;Patient requesting pain meds-RN notified    Home Living Family/patient expects to be discharged to:: Private residence Living Arrangements: Spouse/significant  other Available Help at Discharge: Family;Available PRN/intermittently Type of Home: House Home Access: Ramped entrance     Home Layout: Two level;Able to live on main level with bedroom/bathroom;Full bath on main level Home Equipment: Walker - 2 wheels;Bedside commode;Cane - single point Additional Comments: patient states she takes sink baths most of time    Prior Function Level of Independence: Independent         Comments: Tourist information centre manager, drives     Higher education careers adviser        Extremity/Trunk Assessment   Upper Extremity Assessment Upper Extremity Assessment: Generalized weakness    Lower Extremity Assessment Lower Extremity Assessment: Generalized weakness;LLE deficits/detail LLE Deficits / Details: grossly 3-/5 LLE: Unable to fully assess due to pain LLE Sensation: WNL LLE Coordination: WNL    Cervical / Trunk Assessment Cervical / Trunk Assessment: Normal  Communication   Communication: No difficulties  Cognition Arousal/Alertness: Awake/alert Behavior During Therapy: WFL for tasks assessed/performed Overall Cognitive Status: Within Functional Limits for tasks assessed                                        General Comments      Exercises     Assessment/Plan    PT Assessment Patient needs continued PT services  PT Problem List Decreased strength;Decreased activity tolerance;Decreased balance;Decreased mobility       PT Treatment Interventions DME instruction;Gait training;Stair training;Functional mobility training;Therapeutic activities;Therapeutic exercise;Patient/family education;Balance training    PT Goals (Current goals can be found in the Care Plan section)  Acute Rehab PT Goals Patient Stated Goal: return home after rehab PT Goal Formulation: With patient/family Time For Goal Achievement: 07/08/20 Potential to Achieve Goals: Good    Frequency Min 3X/week   Barriers to discharge        Co-evaluation                AM-PAC PT "6 Clicks" Mobility  Outcome Measure Help needed turning from your back to your side while in a flat bed without using bedrails?: A Lot Help needed moving from lying on your back to sitting on the side of a flat bed without using bedrails?: A Lot Help needed moving to and from a bed to a chair (including a wheelchair)?: A Lot Help needed standing up from a chair using your arms (e.g., wheelchair or bedside chair)?: A Lot Help needed to walk in hospital room?: A Lot Help needed climbing 3-5 steps with a railing? : Total 6 Click Score: 11    End of Session   Activity Tolerance: Patient tolerated treatment well;Patient limited by fatigue Patient left: in chair;with call bell/phone within reach Nurse Communication: Mobility status PT Visit Diagnosis: Unsteadiness on feet (R26.81);Other abnormalities of gait and mobility (R26.89);Muscle weakness (generalized) (M62.81);History of falling (Z91.81)    Time: 3570-1779 PT Time Calculation (min) (ACUTE ONLY): 27 min   Charges:   PT Evaluation $PT Eval Moderate Complexity: 1 Mod PT Treatments $Therapeutic Activity: 23-37 mins        12:15 PM, 06/24/20 Ocie Bob, MPT Physical Therapist with [facility] [phone] office [phone] mobile phone

## 2020-06-24 NOTE — Progress Notes (Signed)
TRH night shift.  The patient told the nursing staff that she uses "breathing treatments" at home as needed and asked the staff to have them ordered. Duoneb every 6 hrs PRN ordered.   Sanda Klein, MD

## 2020-06-24 NOTE — Progress Notes (Signed)
  Echocardiogram 2D Echocardiogram has been performed.  Pieter Partridge 06/24/2020, 9:47 AM

## 2020-06-24 NOTE — Plan of Care (Signed)
  Problem: Acute Rehab PT Goals(only PT should resolve) Goal: Pt Will Go Supine/Side To Sit Outcome: Progressing Flowsheets (Taken 06/24/2020 1216) Pt will go Supine/Side to Sit: with moderate assist Goal: Patient Will Transfer Sit To/From Stand Outcome: Progressing Flowsheets (Taken 06/24/2020 1216) Patient will transfer sit to/from stand:  with minimal assist  with moderate assist Goal: Pt Will Transfer Bed To Chair/Chair To Bed Outcome: Progressing Flowsheets (Taken 06/24/2020 1216) Pt will Transfer Bed to Chair/Chair to Bed:  with min assist  with mod assist Goal: Pt Will Ambulate Outcome: Progressing Flowsheets (Taken 06/24/2020 1216) Pt will Ambulate:  25 feet  with moderate assist  with rolling walker   12:17 PM, 06/24/20 Ocie Bob, MPT Physical Therapist with [facility] [phone] office [phone] mobile phone

## 2020-06-25 LAB — CBC
HCT: 34.1 % — ABNORMAL LOW (ref 36.0–46.0)
Hemoglobin: 10.2 g/dL — ABNORMAL LOW (ref 12.0–15.0)
MCH: 29.3 pg (ref 26.0–34.0)
MCHC: 29.9 g/dL — ABNORMAL LOW (ref 30.0–36.0)
MCV: 98 fL (ref 80.0–100.0)
Platelets: 266 10*3/uL (ref 150–400)
RBC: 3.48 MIL/uL — ABNORMAL LOW (ref 3.87–5.11)
RDW: 14.7 % (ref 11.5–15.5)
WBC: 15.8 10*3/uL — ABNORMAL HIGH (ref 4.0–10.5)
nRBC: 0 % (ref 0.0–0.2)

## 2020-06-25 LAB — BASIC METABOLIC PANEL
Anion gap: 5 (ref 5–15)
BUN: 18 mg/dL (ref 8–23)
CO2: 31 mmol/L (ref 22–32)
Calcium: 8.4 mg/dL — ABNORMAL LOW (ref 8.9–10.3)
Chloride: 103 mmol/L (ref 98–111)
Creatinine, Ser: 0.78 mg/dL (ref 0.44–1.00)
GFR, Estimated: >60 mL/min (ref >60)
Glucose, Bld: 105 mg/dL — ABNORMAL HIGH (ref 70–99)
Potassium: 4.2 mmol/L (ref 3.5–5.1)
Sodium: 139 mmol/L (ref 135–145)

## 2020-06-25 LAB — GLUCOSE, CAPILLARY
Glucose-Capillary: 91 mg/dL (ref 70–99)
Glucose-Capillary: 104 mg/dL — ABNORMAL HIGH (ref 70–99)
Glucose-Capillary: 94 mg/dL (ref 70–99)

## 2020-06-25 LAB — MAGNESIUM: Magnesium: 1.8 mg/dL (ref 1.7–2.4)

## 2020-06-25 MED ORDER — DOXYCYCLINE HYCLATE 100 MG PO TABS
100.0000 mg | ORAL_TABLET | Freq: Two times a day (BID) | ORAL | Status: DC
  Administered 2020-06-25 – 2020-06-29 (×9): 100 mg via ORAL
  Filled 2020-06-25 (×9): qty 1

## 2020-06-25 NOTE — Plan of Care (Signed)
  Problem: Education: Goal: Knowledge of General Education information will improve Description Including pain rating scale, medication(s)/side effects and non-pharmacologic comfort measures Outcome: Progressing   Problem: Health Behavior/Discharge Planning: Goal: Ability to manage health-related needs will improve Outcome: Progressing   

## 2020-06-25 NOTE — Progress Notes (Signed)
Removed foley per order and placed a purewick.

## 2020-06-25 NOTE — Progress Notes (Signed)
   ORTHOPAEDIC PROGRESS NOTE  s/p Procedure(s): CMN for left basicervical femoral neck fracture  DOS: 06/24/20  SUBJECTIVE: Breathing treatment provided over night, improved her breathing.  No issues otherwise.  Worked well with PT yesterday, but she stated it was painful.  Appetite slowly returning.  No nausea or vomiting.  Waiting for placement.   OBJECTIVE: PE:  Alert and oriented, no acute distress  Left hip Lateral hip dressings are clean, dry and intact Chronic lymphedema with no acute worsening Active motion intact to TA/EHL Sensation intact to dorsum of foot Toes are warm and well perfused  Some bruising to left hand.  Full range of motion in all fingers.  No deformity.  Fingers are warm and well perfused.   Vitals:   06/25/20 0237 06/25/20 0408  BP:  (!) 111/54  Pulse:  (!) 102  Resp:  19  Temp:  99 F (37.2 C)  SpO2: 90% 93%   XR of the left hip demonstrates reduced fracture with hardware in good position.  No acute injuries  ASSESSMENT: Alicia Vaughn is a 75 y.o. female doing well postoperatively.  Waiting for placement.  PT recommends SNF  PLAN: Weightbearing: WBAT LLE Insicional and dressing care: Reinforce dressings as needed; they remain clean and dry.  Change as needed.  Orthopedic device(s): None VTE prophylaxis: Ok to resume POD#1; no orthopaedic contraindications.  Recommend 81 mg aspirin BID x 6 weeks Pain control: PO pain medications, PRN; judicious use of narcotics Follow - up plan: 2 weeks   Contact information:     Truth Barot A. Dallas Schimke, MD MS [facility] [address] [place],  [place]  [address] Phone: [phone] Fax: [phone]

## 2020-06-25 NOTE — TOC Initial Note (Signed)
Transition of Care [facility]) - Initial/Assessment Note    Patient Details  Name: Alicia Vaughn MRN: [id number] Date of Birth: January 27, 1945  Transition of Care [facility]) CM/SW Contact:    Barry Brunner, LCSW Phone Number: 06/25/2020, 1:51 PM  Clinical Narrative:                 Patient is a 75 year old female admitted for Closed Left Hip Fracture, initial encounter. Patient reported that she receives assistance completing her ADL's from her son. Patient is ambulatory at baseline. Patient reported that she is fully vaccinated for Covid 19. Patient agreeable to SNF referrals and requested SNF referrals be sent to [facility]. Patient also reported that she did not have her insurance card upon admission, but that her son is bringing it to the hospital. Patient provided a picture of her [employer] and Part A medicare card. Registration is not available on the weekend to enter in information. CSW. Faxed patient to [facility] SNF's with insurance information in comment to facility. TOC to follow.  Expected Discharge Plan: Skilled Nursing Facility Barriers to Discharge: Continued Medical Work up   Patient Goals and CMS Choice Patient states their goals for this hospitalization and ongoing recovery are:: Rehab with SNF CMS Medicare.gov Compare Post Acute Care list provided to:: Patient Choice offered to / list presented to : Patient  Expected Discharge Plan and Services Expected Discharge Plan: Skilled Nursing Facility     Post Acute Care Choice: Skilled Nursing Facility Living arrangements for the past 2 months: Single Family Home                                      Prior Living Arrangements/Services Living arrangements for the past 2 months: Single Family Home Lives with:: Self, Spouse Patient language and need for interpreter reviewed:: Yes        Need for Family Participation in Patient Care: Yes (Comment) Care giver support system in place?: Yes (comment)    Criminal Activity/Legal Involvement Pertinent to Current Situation/Hospitalization: No - Comment as needed  Activities of Daily Living Home Assistive Devices/Equipment: Cane (specify quad or straight) ADL Screening (condition at time of admission) Patient's cognitive ability adequate to safely complete daily activities?: Yes Is the patient deaf or have difficulty hearing?: No Does the patient have difficulty seeing, even when wearing glasses/contacts?: No Does the patient have difficulty concentrating, remembering, or making decisions?: No Patient able to express need for assistance with ADLs?: Yes Does the patient have difficulty dressing or bathing?: No Independently performs ADLs?: Yes (appropriate for developmental age) Does the patient have difficulty walking or climbing stairs?: Yes Weakness of Legs: Both Weakness of Arms/Hands: None  Permission Sought/Granted   Permission granted to share information with : Yes, Verbal Permission Granted  Share Information with NAME: Wood,Rodney  Permission granted to share info w AGENCY: [facility]  Permission granted to share info w Relationship: (Son)  Permission granted to share info w Contact Information: [phone]  Emotional Assessment     Affect (typically observed): Accepting, Adaptable, Appropriate Orientation: : Oriented to Self, Oriented to Place, Oriented to Situation, Oriented to  Time Alcohol / Substance Use: Not Applicable Psych Involvement: No (comment)  Admission diagnosis:  Closed fracture of left hip, initial encounter (HCC) [S72.002A] Fall, initial encounter [W19.XXXA] Closed left hip fracture, initial encounter [facility]) [S72.002A] Patient Active Problem List   Diagnosis Date Noted  Cardiomegaly 06/23/2020   Closed left hip fracture, initial encounter (HCC) 06/22/2020   Normocytic anemia 06/22/2020   COPD (chronic obstructive pulmonary disease) (HCC)    Decreased GFR    Bilateral lower extremity edema     Mild protein malnutrition (HCC)    PCP:  Veverly Fells, MD Pharmacy:   [facility] [phone] - [place], [place] - [address] AT [facility] & [address] [place] [address] Phone: [phone] Fax: [phone]     Social Determinants of Health (SDOH) Interventions    Readmission Risk Interventions No flowsheet data found.

## 2020-06-25 NOTE — Progress Notes (Signed)
PROGRESS NOTE   Alicia Vaughn  LKG:[id number] DOB: December 28, 1945 DOA: 06/22/2020 PCP: Veverly Fells, MD   Chief Complaint  Patient presents with   Fall   Level of care: Telemetry  Brief Admission History:  75 y.o. female with medical history significant of COPD, unspecified liver cirrhosis, stage II lymphedema who is coming to the emergency department due to having an accidental fall from a truck at home landing on her left hip area resulting in immediate pain and inability to stand or bear weight on her LLE.  She was admitted for operative repair.    Assessment & Plan:   Principal Problem:   Closed left hip fracture, initial encounter (HCC) Active Problems:   Normocytic anemia   COPD (chronic obstructive pulmonary disease) (HCC)   Decreased GFR   Bilateral lower extremity edema   Mild protein malnutrition (HCC)   Cardiomegaly  Acute left femur fracture s/p repair POD#2 s/p cephalomedullary nail for left femoral neck fracture.  Postop mgmt per orthopedics team. Aspirin 81 mg BID started for DVT prophylaxis for 6 weeks per ortho.  PT recommending SNF.     Normocytic anemia-stable anticipate some postop anemia and will follow closely.  Leukocytosis - she may have pneumonia based on CXR and started on doxycycline.   COPD-stable, bronchodilators as ordered.  Chronic Lymphedema bilateral lower extremities - elevate legs, TED hose ordered. Restarted home diuretics.   Mild protein calorie malnutrition-dietitian consultation as part of hip surgery work-up  Cardiomegaly-2D echocardiogram ordered  IMPRESSIONS   1. Left ventricular ejection fraction, by estimation, is 60 to 65%. The left ventricle has normal function. The left ventricle has no regional wall motion abnormalities. Left ventricular diastolic parameters were normal.   2. Right ventricular systolic function is normal. The right ventricular size is normal. There is normal pulmonary artery systolic pressure.   3. Moderate  pleural effusion in the left lateral region.   4. The mitral valve is abnormal. Trivial mitral valve regurgitation. No evidence of mitral stenosis.   5. The aortic valve is tricuspid. There is mild calcification of the aortic valve. Aortic valve regurgitation is not visualized. Mild aortic valve sclerosis is present, with no evidence of aortic valve stenosis.   6. The inferior vena cava is normal in size with greater than 50% respiratory variability, suggesting right atrial pressure of 3 mmHg.   DVT prophylaxis: aspirin/SCDs Code Status: Full Family Communication: Patient updated with plan of care at bedside Disposition: To be determined Status is: Inpatient  Remains inpatient appropriate because:Ongoing active pain requiring inpatient pain management  Dispo: The patient is from: Home              Anticipated d/c is to: SNF              Patient currently is not medically stable to d/c.   Difficult to place patient No   Consultants:  Dr. Dallas Schimke orthopedics  Procedures:  Cephalomedullary nail for left femoral neck fracture 06/23/20  Antimicrobials:    Subjective: Pt having pain with ambulation.      Objective: Vitals:   06/24/20 1341 06/24/20 2051 06/25/20 0237 06/25/20 0408  BP: 97/61 106/63  (!) 111/54  Pulse: 87 95  (!) 102  Resp: 16 18  19   Temp:  98.6 F (37 C)  99 F (37.2 C)  TempSrc:  Oral  Oral  SpO2: 100% 92% 90% 93%  Weight:      Height:        Intake/Output Summary (Last 24  hours) at 06/25/2020 1213 Last data filed at 06/25/2020 0700 Gross per 24 hour  Intake 1066.2 ml  Output 2150 ml  Net -1083.8 ml   Filed Weights   06/22/20 2033  Weight: 59 kg    Examination:  General exam: elderly female, awake, oriented x 3, Appears calm and comfortable  Respiratory system: chest congestion, rales, Respiratory effort normal. Cardiovascular system: normal S1 & S2 heard. No JVD, murmurs, rubs, gallops or clicks. No pedal edema. Gastrointestinal system: Abdomen is  nondistended, soft and nontender. No organomegaly or masses felt. Normal bowel sounds heard. Central nervous system: Alert and oriented. No focal neurological deficits. Extremities: wound clean and dry.  Good pedal pulses BLEs.  Skin: No rashes, lesions or ulcers.  Psychiatry: Judgement and insight appear normal. Mood & affect appropriate.   Data Reviewed: I have personally reviewed following labs and imaging studies  CBC: Recent Labs  Lab 06/22/20 2048 06/23/20 0608 06/24/20 0429 06/25/20 0506  WBC 8.5 11.7* 14.2* 15.8*  NEUTROABS 6.5  --   --   --   HGB 11.9* 11.8* 10.5* 10.2*  HCT 38.0 38.3 35.3* 34.1*  MCV 95.0 95.5 97.2 98.0  PLT 368 358 293 266    Basic Metabolic Panel: Recent Labs  Lab 06/22/20 2048 06/23/20 0608 06/24/20 0429 06/25/20 0506  NA 136 139 139 139  K 3.9 3.6 4.4 4.2  CL 100 104 106 103  CO2 30 29 28 31   GLUCOSE 110* 119* 130* 105*  BUN 26* 25* 20 18  CREATININE 1.17* 0.83 0.79 0.78  CALCIUM 8.4* 8.7* 8.5* 8.4*  MG  --   --   --  1.8    GFR: Estimated Creatinine Clearance: 48.2 mL/min (by C-G formula based on SCr of 0.78 mg/dL).  Liver Function Tests: Recent Labs  Lab 06/22/20 2048 06/23/20 0608  AST 16 15  ALT 13 14  ALKPHOS 70 68  BILITOT 0.4 0.4  PROT 6.3* 5.9*  ALBUMIN 3.2* 3.1*    CBG: Recent Labs  Lab 06/24/20 1106 06/24/20 1648 06/24/20 2116 06/25/20 0727 06/25/20 1121  GLUCAP 104* 106* 122* 91 104*    Recent Results (from the past 240 hour(s))  Resp Panel by RT-PCR (Flu A&B, Covid) Nasopharyngeal Swab     Status: None   Collection Time: 06/22/20  9:39 PM   Specimen: Nasopharyngeal Swab; Nasopharyngeal(NP) swabs in vial transport medium  Result Value Ref Range Status   SARS Coronavirus 2 by RT PCR NEGATIVE NEGATIVE Final    Comment: (NOTE) SARS-CoV-2 target nucleic acids are NOT DETECTED.  The SARS-CoV-2 RNA is generally detectable in upper respiratory specimens during the acute phase of infection. The  lowest concentration of SARS-CoV-2 viral copies this assay can detect is 138 copies/mL. A negative result does not preclude SARS-Cov-2 infection and should not be used as the sole basis for treatment or other patient management decisions. A negative result may occur with  improper specimen collection/handling, submission of specimen other than nasopharyngeal swab, presence of viral mutation(s) within the areas targeted by this assay, and inadequate number of viral copies(<138 copies/mL). A negative result must be combined with clinical observations, patient history, and epidemiological information. The expected result is Negative.  Fact Sheet for Patients:  08/22/20  Fact Sheet for Healthcare Providers:  BloggerCourse.com  This test is no t yet approved or cleared by the [place] FDA and  has been authorized for detection and/or diagnosis of SARS-CoV-2 by FDA under an Emergency Use Authorization (EUA). This EUA will remain  in effect (meaning this test can be used) for the duration of the COVID-19 declaration under Section 564(b)(1) of the Act, 21 U.S.C.section 360bbb-3(b)(1), unless the authorization is terminated  or revoked sooner.       Influenza A by PCR NEGATIVE NEGATIVE Final   Influenza B by PCR NEGATIVE NEGATIVE Final    Comment: (NOTE) The Xpert Xpress SARS-CoV-2/FLU/RSV plus assay is intended as an aid in the diagnosis of influenza from Nasopharyngeal swab specimens and should not be used as a sole basis for treatment. Nasal washings and aspirates are unacceptable for Xpert Xpress SARS-CoV-2/FLU/RSV testing.  Fact Sheet for Patients: BloggerCourse.comhttps://www.fda.gov/media/152166/download  Fact Sheet for Healthcare Providers: SeriousBroker.ithttps://www.fda.gov/media/152162/download  This test is not yet approved or cleared by the [place] FDA and has been authorized for detection and/or diagnosis of SARS-CoV-2 by FDA under  an Emergency Use Authorization (EUA). This EUA will remain in effect (meaning this test can be used) for the duration of the COVID-19 declaration under Section 564(b)(1) of the Act, 21 U.S.C. section 360bbb-3(b)(1), unless the authorization is terminated or revoked.  Performed at [facility], [address] [place], [place] [address]   Surgical PCR screen     Status: None   Collection Time: 06/23/20  2:23 AM   Specimen: Nasal Mucosa; Nasal Swab  Result Value Ref Range Status   MRSA, PCR NEGATIVE NEGATIVE Final   Staphylococcus aureus NEGATIVE NEGATIVE Final    Comment: (NOTE) The Xpert SA Assay (FDA approved for NASAL specimens in patients 74 years of age and older), is one component of a comprehensive surveillance program. It is not intended to diagnose infection nor to guide or monitor treatment. Performed at [facility], [address] [place], [place] [address]      Radiology Studies: DG Pelvis Portable  Result Date: 06/23/2020 CLINICAL DATA:  Postop left hip fracture. EXAM: PORTABLE PELVIS 1-2 VIEWS COMPARISON:  06/22/2020 FINDINGS: Left hip fixation hardware in good position. Reduction of the intertrochanteric fracture of the left hip. The right hip is normally located. No pelvic fractures are identified. IMPRESSION: Internal fixation of the left hip fracture with good position/alignment. Electronically Signed   By: Rudie MeyerP.  Gallerani M.D.   On: 06/23/2020 15:15   DG CHEST PORT 1 VIEW  Result Date: 06/24/2020 CLINICAL DATA:  Cough. EXAM: PORTABLE CHEST 1 VIEW COMPARISON:  Two days ago FINDINGS: Hazy lower chest opacity with bilateral small to moderate pleural effusion. There has been increased pleural fluid on the right at least. Cardiomegaly which is partially obscured. No pneumothorax. Large lung volumes with known COPD. IMPRESSION: Increased pleural fluid and lower lobe opacity. Electronically Signed   By: Marnee SpringJonathon  Watts M.D.   On: 06/24/2020 08:08   ECHOCARDIOGRAM  COMPLETE  Result Date: 06/24/2020    ECHOCARDIOGRAM REPORT   Patient Name:   Alita ChyleBRENDA Gimbel Date of Exam: 06/24/2020 Medical Rec #:  [id number]     Height:       59.0 in Accession #:    [id number]    Weight:       130.0 lb Date of Birth:  05/16/1945      BSA:          1.536 m Patient Age:    75 years      BP:           97/55 mmHg Patient Gender: F             HR:           92 bpm. [facility] Procedure: 2D Echo, Cardiac Doppler and Color Doppler Indications:    Cardiomegaly  History:        Patient has no prior history of Echocardiogram examinations.                 COPD; Risk Factors:Former Smoker. Post-op Left hip                 fracture,pleural effusion.  Sonographer:    Lavenia Atlas RDCS Referring Phys: [id number] MARK A CAIRNS IMPRESSIONS  1. Left ventricular ejection fraction, by estimation, is 60 to 65%. The left ventricle has normal function. The left ventricle has no regional wall motion abnormalities. Left ventricular diastolic parameters were normal.  2. Right ventricular systolic function is normal. The right ventricular size is normal. There is normal pulmonary artery systolic pressure.  3. Moderate pleural effusion in the left lateral region.  4. The mitral valve is abnormal. Trivial mitral valve regurgitation. No evidence of mitral stenosis.  5. The aortic valve is tricuspid. There is mild calcification of the aortic valve. Aortic valve regurgitation is not visualized. Mild aortic valve sclerosis is present, with no evidence of aortic valve stenosis.  6. The inferior vena cava is normal in size with greater than 50% respiratory variability, suggesting right atrial pressure of 3 mmHg. FINDINGS  Left Ventricle: Left ventricular ejection fraction, by estimation, is 60 to 65%. The left ventricle has normal function. The left ventricle has no regional wall motion abnormalities. The left ventricular internal cavity size was normal in size. There is  no left ventricular hypertrophy. Left  ventricular diastolic parameters were normal. Right Ventricle: The right ventricular size is normal. No increase in right ventricular wall thickness. Right ventricular systolic function is normal. There is normal pulmonary artery systolic pressure. The tricuspid regurgitant velocity is 2.81 m/s, and  with an assumed right atrial pressure of 3 mmHg, the estimated right ventricular systolic pressure is 34.6 mmHg. Left Atrium: Left atrial size was normal in size. Right Atrium: Right atrial size was normal in size. Pericardium: There is no evidence of pericardial effusion. Mitral Valve: The mitral valve is abnormal. There is mild thickening of the mitral valve leaflet(s). Trivial mitral valve regurgitation. No evidence of mitral valve stenosis. Tricuspid Valve: The tricuspid valve is normal in structure. Tricuspid valve regurgitation is mild . No evidence of tricuspid stenosis. Aortic Valve: The aortic valve is tricuspid. There is mild calcification of the aortic valve. Aortic valve regurgitation is not visualized. Mild aortic valve sclerosis is present, with no evidence of aortic valve stenosis. Pulmonic Valve: The pulmonic valve was normal in structure. Pulmonic valve regurgitation is not visualized. No evidence of pulmonic stenosis. Aorta: The aortic root is normal in size and structure. Venous: The inferior vena cava is normal in size with greater than 50% respiratory variability, suggesting right atrial pressure of 3 mmHg. IAS/Shunts: No atrial level shunt detected by color flow Doppler. Additional Comments: There is a moderate pleural effusion in the left lateral region.  LEFT VENTRICLE PLAX 2D LVIDd:         3.84 cm     Diastology LVIDs:         2.16 cm     LV e' medial:    10.10 cm/s LV PW:         0.93 cm     LV E/e' medial:  8.8 LV IVS:        0.83 cm     LV e' lateral:   10.90  cm/s LVOT diam:     2.10 cm     LV E/e' lateral: 8.1 LV SV:         91 LV SV Index:   59 LVOT Area:     3.46 cm  LV Volumes (MOD) LV  vol d, MOD A4C: 47.2 ml LV vol s, MOD A4C: 13.0 ml LV SV MOD A4C:     47.2 ml RIGHT VENTRICLE RV Basal diam:  3.37 cm RV S prime:     8.59 cm/s TAPSE (M-mode): 2.2 cm LEFT ATRIUM             Index       RIGHT ATRIUM           Index LA diam:        2.70 cm 1.76 cm/m  RA Area:     12.20 cm LA Vol (A2C):   33.7 ml 21.94 ml/m RA Volume:   28.40 ml  18.49 ml/m LA Vol (A4C):   39.1 ml 25.46 ml/m LA Biplane Vol: 37.3 ml 24.29 ml/m  AORTIC VALVE LVOT Vmax:   137.00 cm/s LVOT Vmean:  90.400 cm/s LVOT VTI:    0.262 m  AORTA Ao Root diam: 2.80 cm MITRAL VALVE                TRICUSPID VALVE MV Area (PHT): 6.60 cm     TR Peak grad:   31.6 mmHg MV Decel Time: 115 msec     TR Vmax:        281.00 cm/s MV E velocity: 88.50 cm/s MV A velocity: 107.00 cm/s  SHUNTS MV E/A ratio:  0.83         Systemic VTI:  0.26 m                             Systemic Diam: 2.10 cm Charlton Haws MD Electronically signed by Charlton Haws MD Signature Date/Time: 06/24/2020/11:43:23 AM    Final    DG HIP OPERATIVE UNILAT WITH PELVIS LEFT  Result Date: 06/23/2020 CLINICAL DATA:  Left hip fracture. EXAM: OPERATIVE left HIP (WITH PELVIS IF PERFORMED) 11 VIEWS TECHNIQUE: Fluoroscopic spot image(s) were submitted for interpretation post-operatively. COMPARISON:  Radiograph 06/22/2020 FINDINGS: Multiple fluoroscopic spot images demonstrate placement of a intramedullary gamma nail in the femur, a proximal dynamic hip screw and the single mid femur interlocking screw. Good position and alignment of the intertrochanteric fracture without complicating features. IMPRESSION: Internal fixation of the intertrochanteric fracture. Electronically Signed   By: Rudie Meyer M.D.   On: 06/23/2020 15:16   DG FEMUR PORT MIN 2 VIEWS LEFT  Result Date: 06/23/2020 CLINICAL DATA:  Postop left hip fracture fixation. EXAM: LEFT FEMUR PORTABLE 2 VIEWS COMPARISON:  Radiographs 06/22/2020 FINDINGS: There is a long intramedullary gamma nail in the femur with 1 mid  interlocking screw. There is also a proximal dynamic hip screw in good position. Good position and alignment of the intertrochanteric fracture. IMPRESSION: Internal fixation of intertrochanteric fracture. Good position and alignment. Electronically Signed   By: Rudie Meyer M.D.   On: 06/23/2020 15:13    Scheduled Meds:  aspirin EC  81 mg Oral BID   bumetanide  1 mg Oral BID   Chlorhexidine Gluconate Cloth  6 each Topical Daily   doxycycline  100 mg Oral Q12H   mupirocin ointment  1 application Nasal BID   senna-docusate  1 tablet Oral BID   spironolactone  200 mg Oral  Daily   Continuous Infusions:  sodium chloride 10 mL/hr at 06/24/20 1038    LOS: 2 days   Time spent: 35 mins  Cabela Pacifico Laural Benes, MD How to contact the [facility] Attending or Consulting provider 7A - 7P or covering provider during after hours 7P -7A, for this patient?  Check the care team in [facility] and look for a) attending/consulting [facility] provider listed and b) the [facility] team listed Log into [email or web address] and use [facility] universal password to access. If you do not have the password, please contact the hospital operator. Locate the [facility] provider you are looking for under [facility] and page to a number that you can be directly reached. If you still have difficulty reaching the provider, please page the [facility] (Director on Call) for the Hospitalists listed on amion for assistance.  06/25/2020, 12:13 PM

## 2020-06-25 NOTE — [place] FL2 (Signed)
[place] MEDICAID FL2 LEVEL OF CARE SCREENING TOOL     IDENTIFICATION  Patient Name: Alicia Vaughn Birthdate: 21-Sep-1945 Sex: female Admission Date (Current Location): 06/22/2020  [facility] and [place] Number:      Facility and Address:         Provider Number: [id number]  Attending Physician Name and Address:  Cleora Fleet, MD  Relative Name and Phone Number:       Current Level of Care:   Recommended Level of Care:   Prior Approval Number:    Date Approved/Denied:   PASRR Number: Pending  Discharge Plan: SNF    Current Diagnoses: Patient Active Problem List   Diagnosis Date Noted   Cardiomegaly 06/23/2020   Closed left hip fracture, initial encounter (HCC) 06/22/2020   Normocytic anemia 06/22/2020   COPD (chronic obstructive pulmonary disease) (HCC)    Decreased GFR    Bilateral lower extremity edema    Mild protein malnutrition (HCC)     Orientation RESPIRATION BLADDER Height & Weight     Self, Time, Situation, Place  O2 (4L) Continent Weight: 130 lb (59 kg) Height:  4\' 11"  (149.9 cm)  BEHAVIORAL SYMPTOMS/MOOD NEUROLOGICAL BOWEL NUTRITION STATUS      Continent Diet (Diet regular Room service appropriate? Yes; Fluid consistency: Thin)  AMBULATORY STATUS COMMUNICATION OF NEEDS Skin   Extensive Assist Verbally Other (Comment) (Left Hip incision)                       Personal Care Assistance Level of Assistance  Bathing, Feeding, Dressing Bathing Assistance: Maximum assistance Feeding assistance: Independent Dressing Assistance: Maximum assistance     Functional Limitations Info  Sight, Hearing, Speech Sight Info: Adequate Hearing Info: Impaired Speech Info: Adequate    SPECIAL CARE FACTORS FREQUENCY  PT (By licensed PT)     PT Frequency: 5x              Contractures Contractures Info: Not present    Additional Factors Info  Code Status, Allergies Code Status Info: Full Allergies Info: Azithromycin, Vancomycin            Current Medications (06/25/2020):  This is the current hospital active medication list Current Facility-Administered Medications  Medication Dose Route Frequency Provider Last Rate Last Admin   0.9 %  sodium chloride infusion   Intravenous Continuous Johnson, Clanford L, MD 10 mL/hr at 06/24/20 1038 Rate Change at 06/24/20 1038   acetaminophen (TYLENOL) tablet 650 mg  650 mg Oral Q6H PRN 08/24/20, MD   650 mg at 06/24/20 1038   Or   acetaminophen (TYLENOL) suppository 650 mg  650 mg Rectal Q6H PRN 08/24/20, MD       aspirin EC tablet 81 mg  81 mg Oral BID Johnson, Clanford L, MD   81 mg at 06/25/20 0923   bumetanide (BUMEX) tablet 1 mg  1 mg Oral BID 08/25/20, Clanford L, MD   1 mg at 06/25/20 08/25/20   Chlorhexidine Gluconate Cloth 2 % PADS 6 each  6 each Topical Daily 8841, Clanford L, MD   6 each at 06/25/20 0924   doxycycline (VIBRA-TABS) tablet 100 mg  100 mg Oral Q12H Johnson, Clanford L, MD   100 mg at 06/25/20 0924   HYDROmorphone (DILAUDID) injection 0.5 mg  0.5 mg Intravenous Q2H PRN 08/25/20, MD   0.5 mg at 06/24/20 2305   ipratropium-albuterol (DUONEB) 0.5-2.5 (3) MG/3ML nebulizer solution 3 mL  3 mL Nebulization Q6H  PRN Bobette Mo, MD   3 mL at 06/25/20 0237   mupirocin ointment (BACTROBAN) 2 % 1 application  1 application Nasal BID Oliver Barre, MD   1 application at 06/25/20 0924   ondansetron (ZOFRAN) tablet 4 mg  4 mg Oral Q6H PRN Oliver Barre, MD   4 mg at 06/23/20 0202   Or   ondansetron (ZOFRAN) injection 4 mg  4 mg Intravenous Q6H PRN Oliver Barre, MD       oxyCODONE (Oxy IR/ROXICODONE) immediate release tablet 5 mg  5 mg Oral Q6H PRN Oliver Barre, MD   5 mg at 06/25/20 3382   senna-docusate (Senokot-S) tablet 1 tablet  1 tablet Oral BID Oliver Barre, MD   1 tablet at 06/25/20 5053   spironolactone (ALDACTONE) tablet 200 mg  200 mg Oral Daily Laural Benes, Clanford L, MD   200 mg at 06/25/20 9767     Discharge Medications: Please see  discharge summary for a list of discharge medications.  Relevant Imaging Results:  Relevant Lab Results:   Additional Information Pt SSN: [id number]  Barry Brunner, LCSW

## 2020-06-26 ENCOUNTER — Encounter (HOSPITAL_COMMUNITY): Payer: Self-pay | Admitting: Orthopedic Surgery

## 2020-06-26 MED ORDER — SENNOSIDES-DOCUSATE SODIUM 8.6-50 MG PO TABS
1.0000 | ORAL_TABLET | Freq: Every day | ORAL | Status: DC
  Administered 2020-06-27: 1 via ORAL
  Filled 2020-06-26: qty 1

## 2020-06-26 MED ORDER — GUAIFENESIN ER 600 MG PO TB12
1200.0000 mg | ORAL_TABLET | Freq: Two times a day (BID) | ORAL | Status: DC
  Administered 2020-06-26 – 2020-06-29 (×6): 1200 mg via ORAL
  Filled 2020-06-26 (×7): qty 2

## 2020-06-26 MED ORDER — HEPARIN SOD (PORK) LOCK FLUSH 100 UNIT/ML IV SOLN: 500.0000 [IU] | INTRAVENOUS | Status: DC | PRN

## 2020-06-26 MED ORDER — HYDROMORPHONE HCL 1 MG/ML IJ SOLN: 0.5000 mg | INTRAMUSCULAR | Status: DC | PRN

## 2020-06-26 NOTE — Progress Notes (Signed)
Physical Therapy Treatment Patient Details Name: Alicia Vaughn MRN: [id number] DOB: [birth year] Today's Date: 06/26/2020    History of Present Illness Alicia Vaughn is a 75 y.o. female s/p ORIF Left hip fracture on 06/23/20  with medical history significant of COPD, unspecified liver cirrhosis, stage II lymphedema who is coming to the emergency department due to having an accidental fall from a truck at home landing on her left hip area resulting in immediate pain and inability to stand or bear weight on her LLE.  She denies any type of prodromal symptoms.  She had another fall about a month ago due to lightheadedness, but did not sustain any injuries.  However, she denies chest pain, dizziness, dyspnea, diaphoresis, PND or orthopnea.  She gets frequent lower extremity edema.  She denies abdominal pain, nausea, vomiting, diarrhea, constipation, melena or hematochezia.  No flank pain, dysuria, frequency or hematuria.  Denies polyuria, polydipsia, polyphagia or blurred vision.    PT Comments    Pt slowly progressing able to complete more motion on her own today.  Pt has significant pain in her Rt knee as well, actually states that it is more painful than her Lt    Follow Up Recommendations  SNF     Equipment Recommendations  None recommended by PT    Recommendations for Other Services       Precautions / Restrictions Precautions Precautions: Fall Restrictions LLE Weight Bearing: Weight bearing as tolerated    Mobility  Bed Mobility Overal bed mobility: Needs Assistance Bed Mobility: Supine to Sit;Sit to Supine     Supine to sit: Max assist;Mod assist Sit to supine: Max assist   General bed mobility comments: increased time, labored movement, c/o severe pain BLE with pressure, movement    Transfers Overall transfer level: Needs assistance Equipment used: Rolling walker (2 wheeled) Transfers: Sit to/from [employer] Sit to Stand: Mod assist Stand pivot  transfers: Mod assist       General transfer comment: slow labored movement with difficulty advancing LLE due to increased pain  Ambulation/Gait Ambulation/Gait assistance: Mod assist;Max assist   Assistive device: Rolling walker (2 wheeled) Gait Pattern/deviations: Decreased step length - right;Decreased step length - left;Decreased stride length;Antalgic;Shuffle     General Gait Details: limited to 4-5 slow labored unsteady steps at bedside with mostly shuffling of LLE due to c/o severe hip pain with movement         Cognition Arousal/Alertness: Awake/alert Behavior During Therapy: WFL for tasks assessed/performed Overall Cognitive Status: Within Functional Limits for tasks assessed                                        Exercises General Exercises - Lower Extremity Ankle Circles/Pumps: Both;Supine;10 reps Quad Sets: Both;10 reps Gluteal Sets: Both;10 reps Long Arc Quad: Both;10 reps;Seated Hip ABduction/ADduction: Both;10 reps;Supine        Pertinent Vitals/Pain  7/10 Lt hip     Home Living Family/patient expects to be discharged to:: Private residence Living Arrangements: Spouse/significant other Available Help at Discharge: Family;Available PRN/intermittently Type of Home: House Home Access: Ramped entrance   Home Layout: Two level;Able to live on main level with bedroom/bathroom;Full bath on main level Home Equipment: Walker - 2 wheels;Bedside commode;Cane - single point Additional Comments: patient states she takes sink baths most of time    Prior Function Level of Independence: Independent      Comments: Tourist information centre manager,  drives   PT Goals (current goals can now be found in the care plan section) Acute Rehab PT Goals Patient Stated Goal: progressing PT Goal Formulation: With patient Time For Goal Achievement: 07/08/20 Potential to Achieve Goals: Good    Frequency    Min 3X/week      PT Plan  SNF    End of Session  Equipment Utilized During Treatment: Gait belt Activity Tolerance: Patient tolerated treatment well;Patient limited by fatigue Patient left: with call bell/phone within reach;Other (comment);with nursing/sitter in room (transferred to bed side commode) Nurse Communication: Mobility status PT Visit Diagnosis: Unsteadiness on feet (R26.81);Other abnormalities of gait and mobility (R26.89);Muscle weakness (generalized) (M62.81);History of falling (Z91.81)     Time: 1400-1445 PT Time Calculation (min) (ACUTE ONLY): 45 min  Charges:  $Therapeutic Exercise: 23-37 mins $Therapeutic Activity: 8-22 mins                     Virgina Organ, PT CLT [phone]  06/26/2020, 2:57 PM

## 2020-06-26 NOTE — TOC Progression Note (Addendum)
Transition of Care [facility]) - Progression Note    Patient Details  Name: Lavere Shinsky MRN: [id number] Date of Birth: April 13, 1945  [facility]) CM/SW Contact  Annice Needy, LCSW Phone Number: 06/26/2020, 4:23 PM  Clinical Narrative:    Tacey Heap auth started; [id number] Group [id number]       Medicare part A card provided [id number]  PASRR No:  [id number] A 06/26/2020 Expected Discharge Plan: Skilled Nursing Facility Barriers to Discharge: Continued Medical Work up  Expected Discharge Plan and Services Expected Discharge Plan: Skilled Nursing Facility     Post Acute Care Choice: Skilled Nursing Facility Living arrangements for the past 2 months: Single Family Home                                       Social Determinants of Health (SDOH) Interventions    Readmission Risk Interventions No flowsheet data found.

## 2020-06-26 NOTE — Progress Notes (Signed)
PROGRESS NOTE   Alicia Vaughn  GBT:[id number] DOB: 03/17/1945 DOA: 06/22/2020 PCP: Veverly Fells, MD   Chief Complaint  Patient presents with   Fall   Level of care: Telemetry  Brief Admission History:  75 y.o. female with medical history significant of COPD, unspecified liver cirrhosis, stage II lymphedema who is coming to the emergency department due to having an accidental fall from a truck at home landing on her left hip area resulting in immediate pain and inability to stand or bear weight on her LLE.  She was admitted for operative repair.  Assessment & Plan:   Principal Problem:   Closed left hip fracture, initial encounter (HCC) Active Problems:   Normocytic anemia   COPD (chronic obstructive pulmonary disease) (HCC)   Decreased GFR   Bilateral lower extremity edema   Mild protein malnutrition (HCC)   Cardiomegaly  Acute left femur fracture s/p repair POD#3 s/p cephalomedullary nail for left femoral neck fracture.  Postop mgmt per orthopedics team. Aspirin 81 mg BID started for DVT prophylaxis for 6 weeks per ortho.  PT recommending SNF.     Normocytic anemia-stable anticipate some postop anemia and will follow closely.  Leukocytosis - she may have pneumonia based on CXR and started on doxycycline. Check CBC in AM.   CAP - continue doxycycline 100 mg BID and supportive measures.    COPD-stable, bronchodilators as ordered.  Chronic Lymphedema bilateral lower extremities - elevate legs, TED hose ordered. Restarted home diuretics.   Mild protein calorie malnutrition-dietitian consultation as part of hip surgery work-up  Cardiomegaly-2D echocardiogram ordered  IMPRESSIONS   1. Left ventricular ejection fraction, by estimation, is 60 to 65%. The left ventricle has normal function. The left ventricle has no regional wall motion abnormalities. Left ventricular diastolic parameters were normal.   2. Right ventricular systolic function is normal. The right ventricular size  is normal. There is normal pulmonary artery systolic pressure.   3. Moderate pleural effusion in the left lateral region.   4. The mitral valve is abnormal. Trivial mitral valve regurgitation. No evidence of mitral stenosis.   5. The aortic valve is tricuspid. There is mild calcification of the aortic valve. Aortic valve regurgitation is not visualized. Mild aortic valve sclerosis is present, with no evidence of aortic valve stenosis.   6. The inferior vena cava is normal in size with greater than 50% respiratory variability, suggesting right atrial pressure of 3 mmHg.   DVT prophylaxis: aspirin/SCDs Code Status: Full Family Communication: Patient updated with plan of care at bedside Disposition: To be determined Status is: Inpatient  Remains inpatient appropriate because:Ongoing active pain requiring inpatient pain management  Dispo: The patient is from: Home              Anticipated d/c is to: SNF              Patient currently is not medically stable to d/c.   Difficult to place patient No   Consultants:  Dr. Dallas Schimke orthopedics  Procedures:  Cephalomedullary nail for left femoral neck fracture 06/23/20  Antimicrobials:    Subjective: Pt reports cough and chest congestion. Pain persists in left hip.     Objective: Vitals:   06/25/20 2236 06/26/20 0445 06/26/20 1319 06/26/20 1500  BP: (!) 95/57 115/61 95/65   Pulse: 100 97 99   Resp: 19 18 18    Temp: 97.9 F (36.6 C) 97.7 F (36.5 C) 98.6 F (37 C)   TempSrc: Oral  Oral   SpO2: 95%  96% 95% 95%  Weight:      Height:        Intake/Output Summary (Last 24 hours) at 06/26/2020 1609 Last data filed at 06/26/2020 0346 Gross per 24 hour  Intake 240 ml  Output 900 ml  Net -660 ml   Filed Weights   06/22/20 2033  Weight: 59 kg    Examination:  General exam: elderly female, awake, oriented x 3, Appears calm and comfortable  Respiratory system: chest congestion, rales, Respiratory effort normal. Cardiovascular system:  normal S1 & S2 heard. No JVD, murmurs, rubs, gallops or clicks. No pedal edema. Gastrointestinal system: Abdomen is nondistended, soft and nontender. No organomegaly or masses felt. Normal bowel sounds heard. Central nervous system: Alert and oriented. No focal neurological deficits. Extremities: wound clean and dry.  Good pedal pulses BLEs.  Skin: No rashes, lesions or ulcers.  Psychiatry: Judgement and insight appear normal. Mood & affect appropriate.   Data Reviewed: I have personally reviewed following labs and imaging studies  CBC: Recent Labs  Lab 06/22/20 2048 06/23/20 0608 06/24/20 0429 06/25/20 0506  WBC 8.5 11.7* 14.2* 15.8*  NEUTROABS 6.5  --   --   --   HGB 11.9* 11.8* 10.5* 10.2*  HCT 38.0 38.3 35.3* 34.1*  MCV 95.0 95.5 97.2 98.0  PLT 368 358 293 266    Basic Metabolic Panel: Recent Labs  Lab 06/22/20 2048 06/23/20 0608 06/24/20 0429 06/25/20 0506  NA 136 139 139 139  K 3.9 3.6 4.4 4.2  CL 100 104 106 103  CO2 30 29 28 31   GLUCOSE 110* 119* 130* 105*  BUN 26* 25* 20 18  CREATININE 1.17* 0.83 0.79 0.78  CALCIUM 8.4* 8.7* 8.5* 8.4*  MG  --   --   --  1.8    GFR: Estimated Creatinine Clearance: 48.2 mL/min (by C-G formula based on SCr of 0.78 mg/dL).  Liver Function Tests: Recent Labs  Lab 06/22/20 2048 06/23/20 0608  AST 16 15  ALT 13 14  ALKPHOS 70 68  BILITOT 0.4 0.4  PROT 6.3* 5.9*  ALBUMIN 3.2* 3.1*    CBG: Recent Labs  Lab 06/24/20 1648 06/24/20 2116 06/25/20 0727 06/25/20 1121 06/25/20 1614  GLUCAP 106* 122* 91 104* 94    Recent Results (from the past 240 hour(s))  Resp Panel by RT-PCR (Flu A&B, Covid) Nasopharyngeal Swab     Status: None   Collection Time: 06/22/20  9:39 PM   Specimen: Nasopharyngeal Swab; Nasopharyngeal(NP) swabs in vial transport medium  Result Value Ref Range Status   SARS Coronavirus 2 by RT PCR NEGATIVE NEGATIVE Final    Comment: (NOTE) SARS-CoV-2 target nucleic acids are NOT DETECTED.  The  SARS-CoV-2 RNA is generally detectable in upper respiratory specimens during the acute phase of infection. The lowest concentration of SARS-CoV-2 viral copies this assay can detect is 138 copies/mL. A negative result does not preclude SARS-Cov-2 infection and should not be used as the sole basis for treatment or other patient management decisions. A negative result may occur with  improper specimen collection/handling, submission of specimen other than nasopharyngeal swab, presence of viral mutation(s) within the areas targeted by this assay, and inadequate number of viral copies(<138 copies/mL). A negative result must be combined with clinical observations, patient history, and epidemiological information. The expected result is Negative.  Fact Sheet for Patients:  08/22/20  Fact Sheet for Healthcare Providers:  BloggerCourse.com  This test is no t yet approved or cleared by the [employer] and  has been authorized for detection and/or diagnosis of SARS-CoV-2 by FDA under an Emergency Use Authorization (EUA). This EUA will remain  in effect (meaning this test can be used) for the duration of the COVID-19 declaration under Section 564(b)(1) of the Act, 21 U.S.C.section 360bbb-3(b)(1), unless the authorization is terminated  or revoked sooner.       Influenza A by PCR NEGATIVE NEGATIVE Final   Influenza B by PCR NEGATIVE NEGATIVE Final    Comment: (NOTE) The Xpert Xpress SARS-CoV-2/FLU/RSV plus assay is intended as an aid in the diagnosis of influenza from Nasopharyngeal swab specimens and should not be used as a sole basis for treatment. Nasal washings and aspirates are unacceptable for Xpert Xpress SARS-CoV-2/FLU/RSV testing.  Fact Sheet for Patients: BloggerCourse.com  Fact Sheet for Healthcare Providers: SeriousBroker.it  This test is not yet approved or  cleared by the [place] FDA and has been authorized for detection and/or diagnosis of SARS-CoV-2 by FDA under an Emergency Use Authorization (EUA). This EUA will remain in effect (meaning this test can be used) for the duration of the COVID-19 declaration under Section 564(b)(1) of the Act, 21 U.S.C. section 360bbb-3(b)(1), unless the authorization is terminated or revoked.  Performed at [facility], [address], [place], [place] [address]   Surgical PCR screen     Status: None   Collection Time: 06/23/20  2:23 AM   Specimen: Nasal Mucosa; Nasal Swab  Result Value Ref Range Status   MRSA, PCR NEGATIVE NEGATIVE Final   Staphylococcus aureus NEGATIVE NEGATIVE Final    Comment: (NOTE) The Xpert SA Assay (FDA approved for NASAL specimens in patients 62 years of age and older), is one component of a comprehensive surveillance program. It is not intended to diagnose infection nor to guide or monitor treatment. Performed at [facility], [address], [place], [place] [address]      Radiology Studies: No results found.  Scheduled Meds:  aspirin EC  81 mg Oral BID   bumetanide  1 mg Oral BID   Chlorhexidine Gluconate Cloth  6 each Topical Daily   doxycycline  100 mg Oral Q12H   mupirocin ointment  1 application Nasal BID   senna-docusate  1 tablet Oral BID   spironolactone  200 mg Oral Daily   Continuous Infusions:  sodium chloride 10 mL/hr at 06/24/20 1038    LOS: 3 days   Time spent: 35 mins  Imanuel Pruiett Laural Benes, MD How to contact the [facility] Attending or Consulting provider 7A - 7P or covering provider during after hours 7P -7A, for this patient?  Check the care team in [facility] and look for a) attending/consulting TRH provider listed and b) the [facility] team listed Log into [email or web address] and use [place]'s universal password to access. If you do not have the password, please contact the hospital operator. Locate the [facility] provider you are looking for under [facility] and  page to a number that you can be directly reached. If you still have difficulty reaching the provider, please page the [facility] (Director on Call) for the Hospitalists listed on amion for assistance.  06/26/2020, 4:09 PM

## 2020-06-27 ENCOUNTER — Inpatient Hospital Stay (HOSPITAL_COMMUNITY): Payer: Medicare Other

## 2020-06-27 LAB — CBC
HCT: 34 % — ABNORMAL LOW (ref 36.0–46.0)
Hemoglobin: 10.5 g/dL — ABNORMAL LOW (ref 12.0–15.0)
MCH: 29.3 pg (ref 26.0–34.0)
MCHC: 30.9 g/dL (ref 30.0–36.0)
MCV: 95 fL (ref 80.0–100.0)
Platelets: 314 10*3/uL (ref 150–400)
RBC: 3.58 MIL/uL — ABNORMAL LOW (ref 3.87–5.11)
RDW: 14 % (ref 11.5–15.5)
WBC: 17.2 10*3/uL — ABNORMAL HIGH (ref 4.0–10.5)
nRBC: 0 % (ref 0.0–0.2)

## 2020-06-27 MED ORDER — SODIUM CHLORIDE 0.9 % IV SOLN
2.0000 g | INTRAVENOUS | Status: DC
  Administered 2020-06-27 – 2020-06-28 (×2): 2 g via INTRAVENOUS
  Filled 2020-06-27 (×2): qty 20

## 2020-06-27 MED ORDER — IPRATROPIUM-ALBUTEROL 0.5-2.5 (3) MG/3ML IN SOLN
3.0000 mL | Freq: Three times a day (TID) | RESPIRATORY_TRACT | Status: DC
  Administered 2020-06-27 – 2020-06-29 (×7): 3 mL via RESPIRATORY_TRACT
  Filled 2020-06-27 (×7): qty 3

## 2020-06-27 MED ORDER — IPRATROPIUM-ALBUTEROL 0.5-2.5 (3) MG/3ML IN SOLN
3.0000 mL | Freq: Three times a day (TID) | RESPIRATORY_TRACT | Status: DC
  Administered 2020-06-27: 3 mL via RESPIRATORY_TRACT
  Filled 2020-06-27: qty 3

## 2020-06-27 MED ORDER — GUAIFENESIN-DM 100-10 MG/5ML PO SYRP
5.0000 mL | ORAL_SOLUTION | ORAL | Status: DC | PRN
  Administered 2020-06-27 – 2020-06-28 (×2): 5 mL via ORAL
  Filled 2020-06-27 (×2): qty 5

## 2020-06-27 NOTE — Evaluation (Signed)
Occupational Therapy Evaluation Patient Details Name: Alicia Vaughn MRN: [id number] DOB: [birth year] Today's Date: 06/27/2020    History of Present Illness Chistina Roston is a 75 y.o. female s/p ORIF Left hip fracture on 06/23/20  with medical history significant of COPD, unspecified liver cirrhosis, stage II lymphedema who is coming to the emergency department due to having an accidental fall from a truck at home landing on her left hip area resulting in immediate pain and inability to stand or bear weight on her LLE.  She denies any type of prodromal symptoms.  She had another fall about a month ago due to lightheadedness, but did not sustain any injuries.  However, she denies chest pain, dizziness, dyspnea, diaphoresis, PND or orthopnea.  She gets frequent lower extremity edema.  She denies abdominal pain, nausea, vomiting, diarrhea, constipation, melena or hematochezia.  No flank pain, dysuria, frequency or hematuria.  Denies polyuria, polydipsia, polyphagia or blurred vision.   Clinical Impression   Pt agreeable to OT evaluation this date. Pt able to complete supine to sit and sit to supine with HOB elevated and Mod A. Pt required Mod to Max A for initial sit to stand and Mod A for stand pivot transfer with cuing for weight shift. Extended time and labored movement throughout session. Pt able to complete toilet hygiene after urination while seated on BSC. Pt required total assist to don socks at bed level. Pt will benefit from continued OT in the hospital and recommended venue below to increase strength, balance, and endurance for safe ADL's.     Follow Up Recommendations  SNF;Supervision - Intermittent    Equipment Recommendations  None recommended by OT           Precautions / Restrictions Precautions Precautions: Fall Restrictions Weight Bearing Restrictions: Yes LLE Weight Bearing: Weight bearing as tolerated      Mobility Bed Mobility Overal bed mobility: Needs Assistance Bed  Mobility: Supine to Sit;Sit to Supine     Supine to sit: Mod assist Sit to supine: Mod assist   General bed mobility comments: increased time, labored movement, c/o severe pain BLE with pressure, movement; HOB elevated    Transfers Overall transfer level: Needs assistance Equipment used: Rolling walker (2 wheeled) Transfers: Sit to/from [employer] Sit to Stand: Mod assist;Max assist Stand pivot transfers: Mod assist       General transfer comment: slow labored movement; assist for weight shift    Balance Overall balance assessment: Needs assistance Sitting-balance support: Feet supported;No upper extremity supported Sitting balance-Leahy Scale: Fair Sitting balance - Comments: fair/good seated at EOB   Standing balance support: During functional activity;Bilateral upper extremity supported Standing balance-Leahy Scale: Poor Standing balance comment: using RW                           ADL either performed or assessed with clinical judgement   ADL Overall ADL's : Needs assistance/impaired                 Upper Body Dressing : Total assistance;Bed level Upper Body Dressing Details (indicate cue type and reason): total assist to don socks at bed level     Toilet Transfer: Maximal assistance;Stand-pivot;BSC;RW Toilet Transfer Details (indicate cue type and reason): slow labored movement Toileting- Clothing Manipulation and Hygiene: Sitting/lateral lean;Set up Toileting - Clothing Manipulation Details (indicate cue type and reason): Pt able to wipe after urination with S/U assist.  Vision Baseline Vision/History: No visual deficits Vision Assessment?: No apparent visual deficits                Pertinent Vitals/Pain Pain Assessment: Faces Faces Pain Scale: Hurts whole lot Pain Location: left hip with movement and pressure to BLE due to lymphadema/swelling; R knee area Pain Descriptors / Indicators:  Discomfort;Grimacing;Sharp;Sore;Guarding Pain Intervention(s): Limited activity within patient's tolerance;Monitored during session;Repositioned     Hand Dominance Right   Extremity/Trunk Assessment Upper Extremity Assessment Upper Extremity Assessment: Generalized weakness   Lower Extremity Assessment Lower Extremity Assessment: Defer to PT evaluation   Cervical / Trunk Assessment Cervical / Trunk Assessment: Normal   Communication Communication Communication: No difficulties   Cognition Arousal/Alertness: Awake/alert Behavior During Therapy: WFL for tasks assessed/performed Overall Cognitive Status: Within Functional Limits for tasks assessed                                     General Comments  B LE edema               Home Living Family/patient expects to be discharged to:: Private residence Living Arrangements: Spouse/significant other Available Help at Discharge: Family;Available PRN/intermittently Type of Home: House Home Access: Ramped entrance     Home Layout: Two level;Able to live on main level with bedroom/bathroom;Full bath on main level Alternate Level Stairs-Number of Steps: Patient states she does not go upstairs   Bathroom Shower/Tub: Chief Strategy Officer: Standard Bathroom Accessibility: Yes   Home Equipment: Environmental consultant - 2 wheels;Bedside commode;Cane - single point   Additional Comments: patient states she takes sink baths most of time      Prior Functioning/Environment Level of Independence: Needs assistance  Gait / Transfers Assistance Needed: Community ambulator ADL's / Homemaking Assistance Needed: Pt stated she did most things on her own.   Comments: Tourist information centre manager, drives        OT Problem List: Decreased strength;Decreased range of motion;Decreased activity tolerance;Impaired balance (sitting and/or standing);Pain      OT Treatment/Interventions: Self-care/ADL training;Therapeutic exercise;Energy  conservation;DME and/or AE instruction;Therapeutic activities;Balance training;Patient/family education    OT Goals(Current goals can be found in the care plan section) Acute Rehab OT Goals Patient Stated Goal: get rehab OT Goal Formulation: With patient Time For Goal Achievement: 07/11/20 Potential to Achieve Goals: Good  OT Frequency: Min 2X/week    End of Session Equipment Utilized During Treatment: Rolling walker;Oxygen (2 LPM O2)  Activity Tolerance: Patient tolerated treatment well Patient left: in bed;with call bell/phone within reach;with bed alarm set;Other (comment) (Purewick replaced)  OT Visit Diagnosis: Unsteadiness on feet (R26.81);Repeated falls (R29.6);History of falling (Z91.81);Muscle weakness (generalized) (M62.81);Pain Pain - Right/Left:  (B LE) Pain - part of body: Hip;Knee                Time: 6629-4765 OT Time Calculation (min): 28 min Charges:  OT General Charges $OT Visit: 1 Visit OT Evaluation $OT Eval Low Complexity: 1 Low  Emon Miggins OT, MOT   Danie Chandler 06/27/2020, 11:40 AM

## 2020-06-27 NOTE — TOC Progression Note (Signed)
Transition of Care [facility]) - Progression Note    Patient Details  Name: Alicia Vaughn MRN: [id number] Date of Birth: December 04, 1945  [facility] CM/SW Contact  Annice Needy, LCSW Phone Number: 06/27/2020, 4:20 PM  Clinical Narrative:    Patient provided bed offers of [facility] in [address] and [facility] in [place]. Patient states that she would like to go to rehab in [place] or [facility] as she lives in [place]. Referral sent to [facility] in [place].  Patient reports she is fully vaccinated and boostered for COVID.    Expected Discharge Plan: Skilled Nursing Facility Barriers to Discharge: Continued Medical Work up  Expected Discharge Plan and Services Expected Discharge Plan: Skilled Nursing Facility     Post Acute Care Choice: Skilled Nursing Facility Living arrangements for the past 2 months: Single Family Home                                       Social Determinants of Health (SDOH) Interventions    Readmission Risk Interventions No flowsheet data found.

## 2020-06-27 NOTE — Progress Notes (Signed)
PROGRESS NOTE   Alicia Vaughn  UJW:[id number] DOB: 12/14/1945 DOA: 06/22/2020 PCP: Veverly FellsSanders, Kirk S, MD   Chief Complaint  Patient presents with   Fall   Level of care: Telemetry  Brief Admission History:  75 y.o. female with medical history significant of COPD, unspecified liver cirrhosis, stage II lymphedema who is coming to the emergency department due to having an accidental fall from a truck at home landing on her left hip area resulting in immediate pain and inability to stand or bear weight on her LLE.  She was admitted for operative repair.  Assessment & Plan:   Principal Problem:   Closed left hip fracture, initial encounter (HCC) Active Problems:   Normocytic anemia   COPD (chronic obstructive pulmonary disease) (HCC)   Decreased GFR   Bilateral lower extremity edema   Mild protein malnutrition (HCC)   Cardiomegaly  Acute left femur fracture s/p repair POD#4 s/p cephalomedullary nail for left femoral neck fracture.  Postop mgmt per orthopedics team. Aspirin 81 mg BID started for DVT prophylaxis for 6 weeks per ortho.  PT recommending SNF.     Normocytic anemia-stable anticipate some postop anemia and following.   Leukocytosis - she may have pneumonia based on CXR and started on doxycycline. Check CBC in AM.   CAP - continue ceftriaxone / doxycycline and supportive measures.  Scheduled bronchodilators helping.  Incentive spirometry.   Bilateral pleural effusions with compressive atelectasis - encouraged IS and ambulation.    COPD with mild exacerbation - improved with scheduled bronchodilators and antibiotics.  Chronic Lymphedema bilateral lower extremities - elevate legs, TED hose ordered. Restarted home diuretics.   Mild protein calorie malnutrition-dietitian consultation as part of hip surgery work-up  Cardiomegaly-2D echocardiogram ordered  IMPRESSIONS   1. Left ventricular ejection fraction, by estimation, is 60 to 65%. The left ventricle has normal function.  The left ventricle has no regional wall motion abnormalities. Left ventricular diastolic parameters were normal.   2. Right ventricular systolic function is normal. The right ventricular size is normal. There is normal pulmonary artery systolic pressure.   3. Moderate pleural effusion in the left lateral region.   4. The mitral valve is abnormal. Trivial mitral valve regurgitation. No evidence of mitral stenosis.   5. The aortic valve is tricuspid. There is mild calcification of the aortic valve. Aortic valve regurgitation is not visualized. Mild aortic valve sclerosis is present, with no evidence of aortic valve stenosis.   6. The inferior vena cava is normal in size with greater than 50% respiratory variability, suggesting right atrial pressure of 3 mmHg.   DVT prophylaxis: aspirin/SCDs Code Status: Full Family Communication: Patient updated with plan of care at bedside Disposition: Awaiting for SNF bed assignement Status is: Inpatient  Remains inpatient appropriate because:Ongoing active pain requiring inpatient pain management  Dispo: The patient is from: Home              Anticipated d/c is to: SNF              Patient currently is not medically stable to d/c.   Difficult to place patient No   Consultants:  Dr. Dallas Schimkeairns orthopedics  Procedures:  Cephalomedullary nail for left femoral neck fracture 06/23/20  Antimicrobials:    Subjective: Pt required a neb treatment yesterday but breathing better today, stiff with left hip pain but has been ambulating.     Objective: Vitals:   06/27/20 0500 06/27/20 0917 06/27/20 0944 06/27/20 1228  BP: 110/61   109/67  Pulse:  90   99  Resp: 18   20  Temp: 97.7 F (36.5 C)   97.9 F (36.6 C)  TempSrc: Oral   Oral  SpO2: 96% 97% 93% 99%  Weight:      Height:        Intake/Output Summary (Last 24 hours) at 06/27/2020 1401 Last data filed at 06/27/2020 0900 Gross per 24 hour  Intake 390 ml  Output 750 ml  Net -360 ml   Filed Weights    06/22/20 2033  Weight: 59 kg    Examination:  General exam: elderly female, awake, oriented x 3, Appears calm and comfortable  Respiratory system: chest congestion, bibasilar wheezing,  diminished BS LLL, Respiratory effort normal. Cardiovascular system: normal S1 & S2 heard. No JVD, murmurs, rubs, gallops or clicks. No pedal edema. Gastrointestinal system: Abdomen is nondistended, soft and nontender. No organomegaly or masses felt. Normal bowel sounds heard. Central nervous system: Alert and oriented. No focal neurological deficits. Extremities: wound clean and dry.  Good pedal pulses BLEs.  Skin: No rashes, lesions or ulcers.  Psychiatry: Judgement and insight appear normal. Mood & affect appropriate.   Data Reviewed: I have personally reviewed following labs and imaging studies  CBC: Recent Labs  Lab 06/22/20 2048 06/23/20 0608 06/24/20 0429 06/25/20 0506 06/27/20 0437  WBC 8.5 11.7* 14.2* 15.8* 17.2*  NEUTROABS 6.5  --   --   --   --   HGB 11.9* 11.8* 10.5* 10.2* 10.5*  HCT 38.0 38.3 35.3* 34.1* 34.0*  MCV 95.0 95.5 97.2 98.0 95.0  PLT 368 358 293 266 314    Basic Metabolic Panel: Recent Labs  Lab 06/22/20 2048 06/23/20 0608 06/24/20 0429 06/25/20 0506  NA 136 139 139 139  K 3.9 3.6 4.4 4.2  CL 100 104 106 103  CO2 30 29 28 31   GLUCOSE 110* 119* 130* 105*  BUN 26* 25* 20 18  CREATININE 1.17* 0.83 0.79 0.78  CALCIUM 8.4* 8.7* 8.5* 8.4*  MG  --   --   --  1.8    GFR: Estimated Creatinine Clearance: 48.2 mL/min (by C-G formula based on SCr of 0.78 mg/dL).  Liver Function Tests: Recent Labs  Lab 06/22/20 2048 06/23/20 0608  AST 16 15  ALT 13 14  ALKPHOS 70 68  BILITOT 0.4 0.4  PROT 6.3* 5.9*  ALBUMIN 3.2* 3.1*    CBG: Recent Labs  Lab 06/24/20 1648 06/24/20 2116 06/25/20 0727 06/25/20 1121 06/25/20 1614  GLUCAP 106* 122* 91 104* 94    Recent Results (from the past 240 hour(s))  Resp Panel by RT-PCR (Flu A&B, Covid) Nasopharyngeal Swab      Status: None   Collection Time: 06/22/20  9:39 PM   Specimen: Nasopharyngeal Swab; Nasopharyngeal(NP) swabs in vial transport medium  Result Value Ref Range Status   SARS Coronavirus 2 by RT PCR NEGATIVE NEGATIVE Final    Comment: (NOTE) SARS-CoV-2 target nucleic acids are NOT DETECTED.  The SARS-CoV-2 RNA is generally detectable in upper respiratory specimens during the acute phase of infection. The lowest concentration of SARS-CoV-2 viral copies this assay can detect is 138 copies/mL. A negative result does not preclude SARS-Cov-2 infection and should not be used as the sole basis for treatment or other patient management decisions. A negative result may occur with  improper specimen collection/handling, submission of specimen other than nasopharyngeal swab, presence of viral mutation(s) within the areas targeted by this assay, and inadequate number of viral copies(<138 copies/mL). A negative result must  be combined with clinical observations, patient history, and epidemiological information. The expected result is Negative.  Fact Sheet for Patients:  BloggerCourse.com  Fact Sheet for Healthcare Providers:  SeriousBroker.it  This test is no t yet approved or cleared by the [place] FDA and  has been authorized for detection and/or diagnosis of SARS-CoV-2 by FDA under an Emergency Use Authorization (EUA). This EUA will remain  in effect (meaning this test can be used) for the duration of the COVID-19 declaration under Section 564(b)(1) of the Act, 21 U.S.C.section 360bbb-3(b)(1), unless the authorization is terminated  or revoked sooner.       Influenza A by PCR NEGATIVE NEGATIVE Final   Influenza B by PCR NEGATIVE NEGATIVE Final    Comment: (NOTE) The Xpert Xpress SARS-CoV-2/FLU/RSV plus assay is intended as an aid in the diagnosis of influenza from Nasopharyngeal swab specimens and should not be used as a sole basis  for treatment. Nasal washings and aspirates are unacceptable for Xpert Xpress SARS-CoV-2/FLU/RSV testing.  Fact Sheet for Patients: BloggerCourse.com  Fact Sheet for Healthcare Providers: SeriousBroker.it  This test is not yet approved or cleared by the [place] FDA and has been authorized for detection and/or diagnosis of SARS-CoV-2 by FDA under an Emergency Use Authorization (EUA). This EUA will remain in effect (meaning this test can be used) for the duration of the COVID-19 declaration under Section 564(b)(1) of the Act, 21 U.S.C. section 360bbb-3(b)(1), unless the authorization is terminated or revoked.  Performed at [facility], [address] [place], [place] [address]   Surgical PCR screen     Status: None   Collection Time: 06/23/20  2:23 AM   Specimen: Nasal Mucosa; Nasal Swab  Result Value Ref Range Status   MRSA, PCR NEGATIVE NEGATIVE Final   Staphylococcus aureus NEGATIVE NEGATIVE Final    Comment: (NOTE) The Xpert SA Assay (FDA approved for NASAL specimens in patients 57 years of age and older), is one component of a comprehensive surveillance program. It is not intended to diagnose infection nor to guide or monitor treatment. Performed at [facility], [address], [place], [place] [address]      Radiology Studies: DG CHEST PORT 1 VIEW  Result Date: 06/27/2020 CLINICAL DATA:  Cough this morning, COPD, former smoker, recent hip surgery EXAM: PORTABLE CHEST 1 VIEW COMPARISON:  Portable exam 0808 hours compared to 06/24/2020 FINDINGS: Normal heart size, mediastinal contours, and pulmonary vascularity. Bibasilar atelectasis and pleural effusions unchanged. Upper lungs clear. Emphysematous changes consistent with history of COPD. Skin folds project over RIGHT lung. No pneumothorax. Bones demineralized with evidence of prior thoracolumbar fusion. IMPRESSION: Persistent bibasilar pleural effusions and atelectasis.  Emphysema (ICD10-J43.9). Electronically Signed   By: Ulyses Southward M.D.   On: 06/27/2020 08:39    Scheduled Meds:  aspirin EC  81 mg Oral BID   bumetanide  1 mg Oral BID   Chlorhexidine Gluconate Cloth  6 each Topical Daily   doxycycline  100 mg Oral Q12H   guaiFENesin  1,200 mg Oral BID   ipratropium-albuterol  3 mL Nebulization TID   senna-docusate  1 tablet Oral QHS   spironolactone  200 mg Oral Daily   Continuous Infusions:  sodium chloride 10 mL/hr at 06/24/20 1038   cefTRIAXone (ROCEPHIN)  IV 2 g (06/27/20 0900)    LOS: 4 days   Time spent: 35 mins  Suttyn Cryder Laural Benes, MD How to contact the [facility] Attending or Consulting provider 7A - 7P or covering provider during after hours 7P -  7A, for this patient?  Check the care team in [facility] and look for a) attending/consulting TRH provider listed and b) the [facility] team listed Log into [email or web address] and use [place]'s universal password to access. If you do not have the password, please contact the hospital operator. Locate the [facility] provider you are looking for under [facility] and page to a number that you can be directly reached. If you still have difficulty reaching the provider, please page the [facility] (Director on Call) for the Hospitalists listed on amion for assistance.  06/27/2020, 2:01 PM

## 2020-06-27 NOTE — Plan of Care (Signed)
  Problem: Acute Rehab OT Goals (only OT should resolve) Goal: Pt. Will Perform Grooming Flowsheets (Taken 06/27/2020 1143) Pt Will Perform Grooming:  standing  with min guard assist  with min assist  with adaptive equipment Goal: Pt. Will Perform Lower Body Dressing Flowsheets (Taken 06/27/2020 1143) Pt Will Perform Lower Body Dressing:  with min assist  sitting/lateral leans  with adaptive equipment Goal: Pt. Will Transfer To Toilet Flowsheets (Taken 06/27/2020 1143) Pt Will Transfer to Toilet:  with min guard assist  with supervision  bedside commode  stand pivot transfer Goal: Pt/Caregiver Will Perform Home Exercise Program Flowsheets (Taken 06/27/2020 1143) Pt/caregiver will Perform Home Exercise Program:  Increased strength  Both right and left upper extremity  Independently  Tambi Thole OT, MOT

## 2020-06-27 NOTE — Progress Notes (Addendum)
Physical Therapy Treatment Patient Details Name: Alicia Vaughn MRN: [id number] DOB: [birth year] Today's Date: 06/27/2020    History of Present Illness Alicia Vaughn is a 75 y.o. female s/p ORIF Left hip fracture on 06/23/20  with medical history significant of COPD, unspecified liver cirrhosis, stage II lymphedema who is coming to the emergency department due to having an accidental fall from a truck at home landing on her left hip area resulting in immediate pain and inability to stand or bear weight on her LLE.  She denies any type of prodromal symptoms.  She had another fall about a month ago due to lightheadedness, but did not sustain any injuries.  However, she denies chest pain, dizziness, dyspnea, diaphoresis, PND or orthopnea.  She gets frequent lower extremity edema.  She denies abdominal pain, nausea, vomiting, diarrhea, constipation, melena or hematochezia.  No flank pain, dysuria, frequency or hematuria.  Denies polyuria, polydipsia, polyphagia or blurred vision.    PT Comments    Patient presents in bed with 2 Lpm of O2, and SpO2 at 100%. Patient required min/mod assist to transfer to seated at EOB. Patient was put on room air with SpO2 at 93%. Patient tolerated physical activity well including: heels raise, toe raises, long arc quads, seated marching. During sit to stand patient required mod assist, exertion during ambulation causes SpO2 to decrease to 80% on room air. Patient demonstrated good return to the chair and SpO2 increased to 95% on room air. Patient was left in chair on room air - Nursing staff was notified. Patient will benefit from continued physical therapy in hospital and recommended venue below to increase strength, balance, endurance for safe ADLs and gait.   Follow Up Recommendations  SNF     Equipment Recommendations  None recommended by PT    Recommendations for Other Services       Precautions / Restrictions Precautions Precautions:  Fall Restrictions Weight Bearing Restrictions: Yes LLE Weight Bearing: Weight bearing as tolerated    Mobility  Bed Mobility Overal bed mobility: Needs Assistance Bed Mobility: Supine to Sit;Sit to Supine     Supine to sit: Mod assist Sit to supine: Mod assist   General bed mobility comments: increased time, labored movement, requires assistance for LE movement in bed, HOB elevated Patient Response: Cooperative  Transfers Overall transfer level: Needs assistance Equipment used: Rolling walker (2 wheeled) Transfers: Sit to/from [employer] Sit to Stand: Mod assist Stand pivot transfers: Mod assist       General transfer comment: slow labored movement; assist for weight shift  Ambulation/Gait Ambulation/Gait assistance: Mod assist Gait Distance (Feet): 4 Feet Assistive device: Rolling walker (2 wheeled) Gait Pattern/deviations: Decreased step length - right;Decreased step length - left;Decreased stride length;Antalgic;Shuffle Gait velocity: decreased   General Gait Details: limited to 4-5 slow labored unsteady steps at bedside towards chair with shuffling like gait with c/o of Left hip pain   Stairs             Wheelchair Mobility    Modified Rankin (Stroke Patients Only)       Balance Overall balance assessment: Needs assistance Sitting-balance support: Feet supported;No upper extremity supported Sitting balance-Leahy Scale: Fair Sitting balance - Comments: fair/good seated at EOB   Standing balance support: During functional activity;Bilateral upper extremity supported Standing balance-Leahy Scale: Poor Standing balance comment: using RW                            Cognition  Arousal/Alertness: Awake/alert Behavior During Therapy: WFL for tasks assessed/performed Overall Cognitive Status: Within Functional Limits for tasks assessed                                        Exercises General Exercises - Lower  Extremity Long Arc Quad: Both;10 reps;Seated;AROM Hip Flexion/Marching: Seated;AROM;Strengthening;Both;10 reps Toe Raises: Seated;AROM;Strengthening;Both;10 reps Heel Raises: Seated;AROM;Strengthening;Both;10 reps    General Comments General comments (skin integrity, edema, etc.): B LE edema      Pertinent Vitals/Pain Pain Assessment: Faces Faces Pain Scale: Hurts little more Pain Location: left hip with movement and pressure to BLE due to lymphadema/swelling; R knee area Pain Descriptors / Indicators: Discomfort;Grimacing;Sharp;Sore;Guarding Pain Intervention(s): Limited activity within patient's tolerance;Monitored during session;Repositioned    Home Living Family/patient expects to be discharged to:: Private residence Living Arrangements: Spouse/significant other Available Help at Discharge: Family;Available PRN/intermittently Type of Home: House Home Access: Ramped entrance   Home Layout: Two level;Able to live on main level with bedroom/bathroom;Full bath on main level Home Equipment: Walker - 2 wheels;Bedside commode;Cane - single point Additional Comments: patient states she takes sink baths most of time    Prior Function Level of Independence: Needs assistance  Gait / Transfers Assistance Needed: Community ambulator ADL's / Homemaking Assistance Needed: Pt stated she did most things on her own. Comments: Tourist information centre manager, drives   PT Goals (current goals can now be found in the care plan section) Acute Rehab PT Goals Patient Stated Goal: get rehab PT Goal Formulation: With patient Time For Goal Achievement: 07/08/20 Potential to Achieve Goals: Good Progress towards PT goals: Progressing toward goals    Frequency    Min 3X/week      PT Plan Current plan remains appropriate    Co-evaluation              AM-PAC PT "6 Clicks" Mobility   Outcome Measure  Help needed turning from your back to your side while in a flat bed without using bedrails?: A  Lot Help needed moving from lying on your back to sitting on the side of a flat bed without using bedrails?: A Lot Help needed moving to and from a bed to a chair (including a wheelchair)?: A Lot Help needed standing up from a chair using your arms (e.g., wheelchair or bedside chair)?: A Lot Help needed to walk in hospital room?: A Lot Help needed climbing 3-5 steps with a railing? : Total 6 Click Score: 11    End of Session   Activity Tolerance: Patient tolerated treatment well;Patient limited by fatigue;Patient limited by pain Patient left: in chair;with call bell/phone within reach Nurse Communication: Mobility status PT Visit Diagnosis: Unsteadiness on feet (R26.81);Other abnormalities of gait and mobility (R26.89);Muscle weakness (generalized) (M62.81);History of falling (Z91.81)     Time: 5397-6734 PT Time Calculation (min) (ACUTE ONLY): 32 min  Charges:  $Therapeutic Exercise: 8-22 mins $Therapeutic Activity: 8-22 mins                     1:48 PM, 06/27/20 Eddie Payette Cousler SPT  1:48 PM, 06/27/20 Ocie Bob, MPT Physical Therapist with [facility] [phone] office [phone] mobile phone

## 2020-06-28 LAB — CBC
HCT: 32.5 % — ABNORMAL LOW (ref 36.0–46.0)
Hemoglobin: 10.1 g/dL — ABNORMAL LOW (ref 12.0–15.0)
MCH: 29.5 pg (ref 26.0–34.0)
MCHC: 31.1 g/dL (ref 30.0–36.0)
MCV: 95 fL (ref 80.0–100.0)
Platelets: 335 10*3/uL (ref 150–400)
RBC: 3.42 MIL/uL — ABNORMAL LOW (ref 3.87–5.11)
RDW: 14 % (ref 11.5–15.5)
WBC: 12.5 10*3/uL — ABNORMAL HIGH (ref 4.0–10.5)
nRBC: 0 % (ref 0.0–0.2)

## 2020-06-28 LAB — URINALYSIS, ROUTINE W REFLEX MICROSCOPIC
Bacteria, UA: NONE SEEN
Bilirubin Urine: NEGATIVE
Glucose, UA: NEGATIVE mg/dL
Ketones, ur: NEGATIVE mg/dL
Leukocytes,Ua: NEGATIVE
Nitrite: NEGATIVE
Protein, ur: NEGATIVE mg/dL
Specific Gravity, Urine: 1.009 (ref 1.005–1.030)
pH: 7 (ref 5.0–8.0)

## 2020-06-28 LAB — BASIC METABOLIC PANEL
Anion gap: 9 (ref 5–15)
BUN: 28 mg/dL — ABNORMAL HIGH (ref 8–23)
CO2: 34 mmol/L — ABNORMAL HIGH (ref 22–32)
Calcium: 8.8 mg/dL — ABNORMAL LOW (ref 8.9–10.3)
Chloride: 93 mmol/L — ABNORMAL LOW (ref 98–111)
Creatinine, Ser: 0.9 mg/dL (ref 0.44–1.00)
GFR, Estimated: >60 mL/min (ref >60)
Glucose, Bld: 105 mg/dL — ABNORMAL HIGH (ref 70–99)
Potassium: 3.6 mmol/L (ref 3.5–5.1)
Sodium: 136 mmol/L (ref 135–145)

## 2020-06-28 LAB — DIFFERENTIAL
Abs Immature Granulocytes: 0.34 10*3/uL — ABNORMAL HIGH (ref 0.00–0.07)
Basophils Absolute: 0.1 10*3/uL (ref 0.0–0.1)
Basophils Relative: 1 %
Eosinophils Absolute: 0.1 10*3/uL (ref 0.0–0.5)
Eosinophils Relative: 1 %
Immature Granulocytes: 3 %
Lymphocytes Relative: 4 %
Lymphs Abs: 0.5 10*3/uL — ABNORMAL LOW (ref 0.7–4.0)
Monocytes Absolute: 1.4 10*3/uL — ABNORMAL HIGH (ref 0.1–1.0)
Monocytes Relative: 11 %
Neutro Abs: 10 10*3/uL — ABNORMAL HIGH (ref 1.7–7.7)
Neutrophils Relative %: 80 %

## 2020-06-28 LAB — MAGNESIUM: Magnesium: 2 mg/dL (ref 1.7–2.4)

## 2020-06-28 MED ORDER — BUMETANIDE 1 MG PO TABS
1.0000 mg | ORAL_TABLET | Freq: Every day | ORAL | Status: DC
  Administered 2020-06-28 – 2020-06-29 (×2): 1 mg via ORAL
  Filled 2020-06-28 (×2): qty 1

## 2020-06-28 MED ORDER — SENNOSIDES-DOCUSATE SODIUM 8.6-50 MG PO TABS
2.0000 | ORAL_TABLET | Freq: Every day | ORAL | Status: DC
  Administered 2020-06-28: 2 via ORAL
  Filled 2020-06-28: qty 2

## 2020-06-28 MED ORDER — ASPIRIN EC 81 MG PO TBEC
81.0000 mg | DELAYED_RELEASE_TABLET | Freq: Two times a day (BID) | ORAL | Status: DC
  Administered 2020-06-28 – 2020-06-29 (×3): 81 mg via ORAL
  Filled 2020-06-28 (×3): qty 1

## 2020-06-28 MED ORDER — PANTOPRAZOLE SODIUM 40 MG PO TBEC
40.0000 mg | DELAYED_RELEASE_TABLET | Freq: Every day | ORAL | Status: DC
  Administered 2020-06-28 – 2020-06-29 (×2): 40 mg via ORAL
  Filled 2020-06-28 (×2): qty 1

## 2020-06-28 MED ORDER — BUMETANIDE 1 MG PO TABS
2.0000 mg | ORAL_TABLET | Freq: Every day | ORAL | Status: DC
  Administered 2020-06-29: 2 mg via ORAL
  Filled 2020-06-28: qty 2

## 2020-06-28 NOTE — TOC Progression Note (Signed)
Transition of Care [facility]) - Progression Note    Patient Details  Name: Alicia Vaughn MRN: [id number] Date of Birth: October 28, 1945  Transition of Care [facility]) CM/SW Contact  Annice Needy, LCSW Phone Number: 06/28/2020, 2:01 PM  Clinical Narrative:    Gus Puma, requested that patient be referred to [facility]. Message left for Mabeline Caras at [facility]. Referral faxed to [phone]. Son also interested in [facility]. Advised that [facility] is ALF and not the appropriate level of care. Patient has offers from [employer] and [facility].    Expected Discharge Plan: Skilled Nursing Facility Barriers to Discharge: Continued Medical Work up  Expected Discharge Plan and Services Expected Discharge Plan: Skilled Nursing Facility     Post Acute Care Choice: Skilled Nursing Facility Living arrangements for the past 2 months: Single Family Home                                       Social Determinants of Health (SDOH) Interventions    Readmission Risk Interventions No flowsheet data found.

## 2020-06-28 NOTE — Care Management Important Message (Signed)
Important Message  Patient Details  Name: Alicia Vaughn MRN: [id number] Date of Birth: Aug 14, 1945   Medicare Important Message Given:  Yes     Corey Harold 06/28/2020, 1:51 PM

## 2020-06-28 NOTE — Progress Notes (Addendum)
PROGRESS NOTE   Alicia Vaughn  HCW:[id number] DOB: 1945/03/18 DOA: 06/22/2020 PCP: Veverly Fells, MD   Chief Complaint  Patient presents with   Fall   Level of care: Telemetry  Brief Admission History:  75 y.o. female with medical history significant of COPD, unspecified liver cirrhosis, stage II lymphedema who is coming to the emergency department due to having an accidental fall from a truck at home landing on her left hip area resulting in immediate pain and inability to stand or bear weight on her LLE.  She was admitted for operative repair.  Assessment & Plan:   Principal Problem:   Closed left hip fracture, initial encounter [facility]) Active Problems:   Bilateral lower extremity edema   Normocytic anemia   COPD (chronic obstructive pulmonary disease) (HCC)   Decreased GFR   Mild protein malnutrition (HCC)   Cardiomegaly   1)Acute Left Basicervical Femoral Neck Fracture --s/p Cephalomedullary Nail for Left Basicervical Femoral Neck Fracture  on 06/23/20 --Postop mgmt per orthopedics team.  -WBAT Aspirin 81 mg BID started for DVT prophylaxis for 6 weeks per ortho.   Ortho follow up Plan-Follow up plan: approximately 2 weeks postop for incision check and XR.  If the patient will be discharged to a nursing facility, staples can be removed around this time and a follow up appointment can be scheduled for 6 weeks after surgery. XR at next visit:  please obtain AP pelvis, and 2 views of the Left hip PT recommending SNF Rehab  2)Acute on Chronic Normocytic Anemia-stable, --suspect some degree of ABLA -Hemoglobin was above 11 on admission -Hemoglobin currently stable above 10  3)Leukocytosis - ?? Reactive in the postoperative patient -WBC trending down -No definite pneumonia on chest x-ray  -Check UA  4)Cardiomegaly with bilateral pleural effusions with compressive atelectasis - Pt with H/o Liver Cirrhosis -Imaging studies without definite pneumonia -Echocardiogram with  preserved EF of 60 to 65% without diastolic dysfunction -Low serum albumin around 3.0 noted  encouraged IS and ambulation.   -s/p prior thoracentensis as per pt and son   5)COPD with mild exacerbation - improved with scheduled bronchodilators and doxycycline  6)Chronic Lymphedema bilateral lower extremities -  Pt with H/o Liver Cirrhosis --Low serum albumin around 3.0 noted elevate legs, TED hose.  -Change Bumex to 2 mg every morning and 1 mg every afternoon, continue Aldactone 200 mg daily  7)Mild Protein Calorie Malnutrition-dietitian consultation as part of hip surgery work-up --Low serum albumin around 3.0 noted --Nutritional supplements recommended   Cardiomegaly-2D echocardiogram ordered  IMPRESSIONS   1. Left ventricular ejection fraction, by estimation, is 60 to 65%. The left ventricle has normal function. The left ventricle has no regional wall motion abnormalities. Left ventricular diastolic parameters were normal.   2. Right ventricular systolic function is normal. The right ventricular size is normal. There is normal pulmonary artery systolic pressure.   3. Moderate pleural effusion in the left lateral region.   4. The mitral valve is abnormal. Trivial mitral valve regurgitation. No evidence of mitral stenosis.   5. The aortic valve is tricuspid. There is mild calcification of the aortic valve. Aortic valve regurgitation is not visualized. Mild aortic valve sclerosis is present, with no evidence of aortic valve stenosis.   6. The inferior vena cava is normal in size with greater than 50% respiratory variability, suggesting right atrial pressure of 3 mmHg.   DVT prophylaxis: Aspirin/SCDs Code Status: Full Family Communication: Patient updated with plan of care at bedside/ -At patient's request I  called patient's son and discussed her care with her son Disposition: Awaiting SNF bed assignement  Status is: Inpatient  Remains inpatient appropriate because:Ongoing active  pain requiring inpatient pain management  Dispo: The patient is from: Home              Anticipated d/c is to: SNF              Patient currently is not medically stable to d/c.   Difficult to place patient No   Consultants:  Dr. Dallas Schimke orthopedics  Procedures:  Cephalomedullary nail for left femoral neck fracture 06/23/20  Antimicrobials:    Subjective: -Denies chest pains or shortness of breath, -Patient questioning why lower extremity edema is persistent No fever  Or chills  No Nausea, Vomiting or Diarrhea -At patient's request I called patient's son and discussed her care with her son  Objective: Vitals:   06/27/20 2142 06/28/20 0453 06/28/20 0717 06/28/20 1309  BP: 115/61 100/69  (!) 107/56  Pulse: 96 86  98  Resp: 18   18  Temp: 97.6 F (36.4 C) 98.9 F (37.2 C)  98.4 F (36.9 C)  TempSrc: Oral Oral  Oral  SpO2: 90% 93% 96% 92%  Weight:      Height:        Intake/Output Summary (Last 24 hours) at 06/28/2020 1311 Last data filed at 06/28/2020 1307 Gross per 24 hour  Intake 1000 ml  Output 1350 ml  Net -350 ml   Filed Weights   06/22/20 2033  Weight: 59 kg   Examination:   Physical Exam Gen:- Awake Alert,  in no apparent distress  HEENT:- House.AT, No sclera icterus Neck-Supple Neck,No JVD,.  Lungs-  diminished in bases, no wheezing or Rhonchi CV- S1, S2 normal Abd-  +ve B.Sounds, Abd Soft, No tenderness,    Extremity/Skin:- 2+ pitting edema, +pulses  Psych-affect is appropriate, oriented x3 Neuro-Generalized weakness, no new focal deficits, no tremors MSK- Lt Hip post-op wound is C/D/I   Data Reviewed: I have personally reviewed following labs and imaging studies  CBC: Recent Labs  Lab 06/22/20 2048 06/23/20 0608 06/24/20 0429 06/25/20 0506 06/27/20 0437 06/28/20 0441  WBC 8.5 11.7* 14.2* 15.8* 17.2* 12.5*  NEUTROABS 6.5  --   --   --   --  10.0*  HGB 11.9* 11.8* 10.5* 10.2* 10.5* 10.1*  HCT 38.0 38.3 35.3* 34.1* 34.0* 32.5*  MCV 95.0  95.5 97.2 98.0 95.0 95.0  PLT 368 358 293 266 314 335    Basic Metabolic Panel: Recent Labs  Lab 06/22/20 2048 06/23/20 0608 06/24/20 0429 06/25/20 0506 06/28/20 0441  NA 136 139 139 139 136  K 3.9 3.6 4.4 4.2 3.6  CL 100 104 106 103 93*  CO2 30 29 28 31  34*  GLUCOSE 110* 119* 130* 105* 105*  BUN 26* 25* 20 18 28*  CREATININE 1.17* 0.83 0.79 0.78 0.90  CALCIUM 8.4* 8.7* 8.5* 8.4* 8.8*  MG  --   --   --  1.8 2.0    GFR: Estimated Creatinine Clearance: 42.9 mL/min (by C-G formula based on SCr of 0.9 mg/dL).  Liver Function Tests: Recent Labs  Lab 06/22/20 2048 06/23/20 0608  AST 16 15  ALT 13 14  ALKPHOS 70 68  BILITOT 0.4 0.4  PROT 6.3* 5.9*  ALBUMIN 3.2* 3.1*    CBG: Recent Labs  Lab 06/24/20 1648 06/24/20 2116 06/25/20 0727 06/25/20 1121 06/25/20 1614  GLUCAP 106* 122* 91 104* 94    Recent Results (  from the past 240 hour(s))  Resp Panel by RT-PCR (Flu A&B, Covid) Nasopharyngeal Swab     Status: None   Collection Time: 06/22/20  9:39 PM   Specimen: Nasopharyngeal Swab; Nasopharyngeal(NP) swabs in vial transport medium  Result Value Ref Range Status   SARS Coronavirus 2 by RT PCR NEGATIVE NEGATIVE Final    Comment: (NOTE) SARS-CoV-2 target nucleic acids are NOT DETECTED.  The SARS-CoV-2 RNA is generally detectable in upper respiratory specimens during the acute phase of infection. The lowest concentration of SARS-CoV-2 viral copies this assay can detect is 138 copies/mL. A negative result does not preclude SARS-Cov-2 infection and should not be used as the sole basis for treatment or other patient management decisions. A negative result may occur with  improper specimen collection/handling, submission of specimen other than nasopharyngeal swab, presence of viral mutation(s) within the areas targeted by this assay, and inadequate number of viral copies(<138 copies/mL). A negative result must be combined with clinical observations, patient history,  and epidemiological information. The expected result is Negative.  Fact Sheet for Patients:  BloggerCourse.com  Fact Sheet for Healthcare Providers:  SeriousBroker.it  This test is no t yet approved or cleared by the [place] FDA and  has been authorized for detection and/or diagnosis of SARS-CoV-2 by FDA under an Emergency Use Authorization (EUA). This EUA will remain  in effect (meaning this test can be used) for the duration of the COVID-19 declaration under Section 564(b)(1) of the Act, 21 U.S.C.section 360bbb-3(b)(1), unless the authorization is terminated  or revoked sooner.       Influenza A by PCR NEGATIVE NEGATIVE Final   Influenza B by PCR NEGATIVE NEGATIVE Final    Comment: (NOTE) The Xpert Xpress SARS-CoV-2/FLU/RSV plus assay is intended as an aid in the diagnosis of influenza from Nasopharyngeal swab specimens and should not be used as a sole basis for treatment. Nasal washings and aspirates are unacceptable for Xpert Xpress SARS-CoV-2/FLU/RSV testing.  Fact Sheet for Patients: BloggerCourse.com  Fact Sheet for Healthcare Providers: SeriousBroker.it  This test is not yet approved or cleared by the [place] FDA and has been authorized for detection and/or diagnosis of SARS-CoV-2 by FDA under an Emergency Use Authorization (EUA). This EUA will remain in effect (meaning this test can be used) for the duration of the COVID-19 declaration under Section 564(b)(1) of the Act, 21 U.S.C. section 360bbb-3(b)(1), unless the authorization is terminated or revoked.  Performed at [facility], [address], [place], [place] [address]   Surgical PCR screen     Status: None   Collection Time: 06/23/20  2:23 AM   Specimen: Nasal Mucosa; Nasal Swab  Result Value Ref Range Status   MRSA, PCR NEGATIVE NEGATIVE Final   Staphylococcus aureus NEGATIVE NEGATIVE Final     Comment: (NOTE) The Xpert SA Assay (FDA approved for NASAL specimens in patients 40 years of age and older), is one component of a comprehensive surveillance program. It is not intended to diagnose infection nor to guide or monitor treatment. Performed at [facility], [address], [place], [place] [address]      Radiology Studies: DG CHEST PORT 1 VIEW  Result Date: 06/27/2020 CLINICAL DATA:  Cough this morning, COPD, former smoker, recent hip surgery EXAM: PORTABLE CHEST 1 VIEW COMPARISON:  Portable exam 0808 hours compared to 06/24/2020 FINDINGS: Normal heart size, mediastinal contours, and pulmonary vascularity. Bibasilar atelectasis and pleural effusions unchanged. Upper lungs clear. Emphysematous changes consistent with history of COPD. Skin folds project over  RIGHT lung. No pneumothorax. Bones demineralized with evidence of prior thoracolumbar fusion. IMPRESSION: Persistent bibasilar pleural effusions and atelectasis. Emphysema (ICD10-J43.9). Electronically Signed   By: Ulyses SouthwardMark  Boles M.D.   On: 06/27/2020 08:39    Scheduled Meds:  aspirin EC  81 mg Oral BID   bumetanide  1 mg Oral BID   Chlorhexidine Gluconate Cloth  6 each Topical Daily   doxycycline  100 mg Oral Q12H   guaiFENesin  1,200 mg Oral BID   ipratropium-albuterol  3 mL Nebulization TID   senna-docusate  1 tablet Oral QHS   spironolactone  200 mg Oral Daily   Continuous Infusions:  sodium chloride 10 mL/hr at 06/24/20 1038   cefTRIAXone (ROCEPHIN)  IV 2 g (06/28/20 0822)    LOS: 5 days   Shon Haleourage Jaythen Hamme, MD How to contact the [facility] Attending or Consulting provider 7A - 7P or covering provider during after hours 7P -7A, for this patient?  Check the care team in [facility] and look for a) attending/consulting [facility] provider listed and b) the [facility] team listed Log into [email or web address] and use [facility]'s universal password to access. If you do not have the password, please contact the hospital operator. Locate the [facility] provider you  are looking for under [facility] and page to a number that you can be directly reached. If you still have difficulty reaching the provider, please page the [facility] (Director on Call) for the Hospitalists listed on amion for assistance.  06/28/2020, 1:11 PM

## 2020-06-29 LAB — CBC
HCT: 31.3 % — ABNORMAL LOW (ref 36.0–46.0)
Hemoglobin: 9.8 g/dL — ABNORMAL LOW (ref 12.0–15.0)
MCH: 29.8 pg (ref 26.0–34.0)
MCHC: 31.3 g/dL (ref 30.0–36.0)
MCV: 95.1 fL (ref 80.0–100.0)
Platelets: 366 10*3/uL (ref 150–400)
RBC: 3.29 MIL/uL — ABNORMAL LOW (ref 3.87–5.11)
RDW: 14.1 % (ref 11.5–15.5)
WBC: 10.6 10*3/uL — ABNORMAL HIGH (ref 4.0–10.5)
nRBC: 0 % (ref 0.0–0.2)

## 2020-06-29 LAB — RESP PANEL BY RT-PCR (FLU A&B, COVID) ARPGX2
Influenza A by PCR: NEGATIVE
Influenza B by PCR: NEGATIVE
SARS Coronavirus 2 by RT PCR: NEGATIVE

## 2020-06-29 MED ORDER — ACETAMINOPHEN 325 MG PO TABS: 650.0000 mg | ORAL_TABLET | Freq: Four times a day (QID) | ORAL | 0 refills | Status: AC | PRN

## 2020-06-29 MED ORDER — BUMETANIDE 1 MG PO TABS
1.0000 mg | ORAL_TABLET | ORAL | 2 refills | Status: AC
Start: 2020-06-29 — End: ?

## 2020-06-29 MED ORDER — GUAIFENESIN-DM 100-10 MG/5ML PO SYRP: 5.0000 mL | ORAL_SOLUTION | ORAL | 0 refills | Status: DC | PRN

## 2020-06-29 MED ORDER — ALBUTEROL SULFATE HFA 108 (90 BASE) MCG/ACT IN AERS: 2.0000 | INHALATION_SPRAY | RESPIRATORY_TRACT | 2 refills | Status: AC | PRN

## 2020-06-29 MED ORDER — PANTOPRAZOLE SODIUM 40 MG PO TBEC: 40.0000 mg | DELAYED_RELEASE_TABLET | Freq: Every day | ORAL | 2 refills | Status: AC

## 2020-06-29 MED ORDER — OXYCODONE HCL 5 MG PO TABS: 5.0000 mg | ORAL_TABLET | Freq: Four times a day (QID) | ORAL | 0 refills | Status: AC | PRN

## 2020-06-29 MED ORDER — DOXYCYCLINE HYCLATE 100 MG PO TABS: 100.0000 mg | ORAL_TABLET | Freq: Two times a day (BID) | ORAL | 0 refills | Status: AC

## 2020-06-29 MED ORDER — SENNOSIDES-DOCUSATE SODIUM 8.6-50 MG PO TABS: 2.0000 | ORAL_TABLET | Freq: Every day | ORAL | 2 refills | Status: AC

## 2020-06-29 MED ORDER — BUDESONIDE-FORMOTEROL FUMARATE 160-4.5 MCG/ACT IN AERO: 2.0000 | INHALATION_SPRAY | Freq: Two times a day (BID) | RESPIRATORY_TRACT | 12 refills | Status: AC

## 2020-06-29 MED ORDER — GUAIFENESIN ER 600 MG PO TB12
1200.0000 mg | ORAL_TABLET | Freq: Two times a day (BID) | ORAL | 0 refills | Status: DC
Start: 2020-06-29 — End: 2020-09-27

## 2020-06-29 MED ORDER — ASPIRIN 81 MG PO TBEC: 81.0000 mg | DELAYED_RELEASE_TABLET | Freq: Two times a day (BID) | ORAL | 1 refills | Status: AC

## 2020-06-29 MED ORDER — PREDNISONE 20 MG PO TABS: 40.0000 mg | ORAL_TABLET | Freq: Every day | ORAL | 0 refills | Status: AC

## 2020-06-29 NOTE — Progress Notes (Signed)
Physical Therapy Treatment Patient Details Name: Alicia Vaughn MRN: [id number] DOB: 16-Aug-1945 Today's Date: 06/29/2020    History of Present Illness Alicia Vaughn is a 75 y.o. female s/p ORIF Left hip fracture on 06/23/20  with medical history significant of COPD, unspecified liver cirrhosis, stage II lymphedema who is coming to the emergency department due to having an accidental fall from a truck at home landing on her left hip area resulting in immediate pain and inability to stand or bear weight on her LLE.  She denies any type of prodromal symptoms.  She had another fall about a month ago due to lightheadedness, but did not sustain any injuries.  However, she denies chest pain, dizziness, dyspnea, diaphoresis, PND or orthopnea.  She gets frequent lower extremity edema.  She denies abdominal pain, nausea, vomiting, diarrhea, constipation, melena or hematochezia.  No flank pain, dysuria, frequency or hematuria.  Denies polyuria, polydipsia, polyphagia or blurred vision.    PT Comments    Patient presents seated in chair (assisted by OT staff) and agreeable for therapy.  Patient requires active assistance for completing LLE LAQ's and hip flexion due to increased pain and weakness, demonstrates increased LLE strength for completing sit to stands and able to take a few steps at bedside before having to sit due to fatigue.  Patient tolerated staying up in chair after therapy and on room air throughout visit with SpO2 at 98% - RN notified.  Patient will benefit from continued physical therapy in hospital and recommended venue below to increase strength, balance, endurance for safe ADLs and gait.     Follow Up Recommendations  SNF     Equipment Recommendations  None recommended by PT    Recommendations for Other Services       Precautions / Restrictions Precautions Precautions: Fall Restrictions Weight Bearing Restrictions: No LLE Weight Bearing: Weight bearing as tolerated    Mobility   Bed Mobility Overal bed mobility: Needs Assistance Bed Mobility: Supine to Sit;Sit to Supine     Supine to sit: Min assist;Mod assist     General bed mobility comments: Patient presents up in chair (assisted by OT)    Transfers Overall transfer level: Needs assistance Equipment used: Rolling walker (2 wheeled) Transfers: Sit to/from [employer] Sit to Stand: Min assist;Mod assist Stand pivot transfers: Mod assist       General transfer comment: increased LLE strength for completing sit to stands  Ambulation/Gait Ambulation/Gait assistance: Mod assist Gait Distance (Feet): 6 Feet Assistive device: Rolling walker (2 wheeled) Gait Pattern/deviations: Decreased step length - right;Decreased step length - left;Decreased stride length;Antalgic;Shuffle Gait velocity: decreased   General Gait Details: slightly increased tolerance for taking steps at bedside with slow labored movement and limited weightbearing on LLE due to c/o increased pain   Stairs             Wheelchair Mobility    Modified Rankin (Stroke Patients Only)       Balance Overall balance assessment: Needs assistance Sitting-balance support: Feet supported;No upper extremity supported Sitting balance-Leahy Scale: Good Sitting balance - Comments: seated EOB   Standing balance support: During functional activity;Bilateral upper extremity supported Standing balance-Leahy Scale: Poor Standing balance comment: fair/poor using RW                            Cognition Arousal/Alertness: Awake/alert Behavior During Therapy: WFL for tasks assessed/performed Overall Cognitive Status: Within Functional Limits for tasks assessed  Exercises General Exercises - Lower Extremity Long Arc Quad: Both;10 reps;Seated;AROM;AAROM Hip Flexion/Marching: Seated;AROM;AAROM;Left;10 reps Toe Raises: Seated;AROM;Strengthening;Both;20  reps Heel Raises: Seated;AROM;Strengthening;Both;20 reps    General Comments        Pertinent Vitals/Pain Pain Assessment: 0-10 Pain Score: 8  Pain Location: L hip Pain Descriptors / Indicators: Discomfort;Grimacing;Guarding;Sore Pain Intervention(s): Limited activity within patient's tolerance;Monitored during session;Repositioned    Home Living                      Prior Function            PT Goals (current goals can now be found in the care plan section) Acute Rehab PT Goals Patient Stated Goal: get rehab PT Goal Formulation: With patient Time For Goal Achievement: 07/08/20 Potential to Achieve Goals: Good Progress towards PT goals: Progressing toward goals    Frequency    Min 3X/week      PT Plan Current plan remains appropriate    Co-evaluation              AM-PAC PT "6 Clicks" Mobility   Outcome Measure  Help needed turning from your back to your side while in a flat bed without using bedrails?: A Lot Help needed moving from lying on your back to sitting on the side of a flat bed without using bedrails?: A Lot Help needed moving to and from a bed to a chair (including a wheelchair)?: A Lot Help needed standing up from a chair using your arms (e.g., wheelchair or bedside chair)?: A Lot Help needed to walk in hospital room?: A Lot Help needed climbing 3-5 steps with a railing? : Total 6 Click Score: 11    End of Session   Activity Tolerance: Patient tolerated treatment well;Patient limited by fatigue Patient left: in chair;with call bell/phone within reach Nurse Communication: Mobility status PT Visit Diagnosis: Unsteadiness on feet (R26.81);Other abnormalities of gait and mobility (R26.89);Muscle weakness (generalized) (M62.81);History of falling (Z91.81)     Time: 4496-7591 PT Time Calculation (min) (ACUTE ONLY): 20 min  Charges:  $Therapeutic Exercise: 8-22 mins $Therapeutic Activity: 8-22 mins                     1:54 PM,  06/29/20 Alicia Vaughn, MPT Physical Therapist with [facility] [phone] office [phone] mobile phone

## 2020-06-29 NOTE — Discharge Summary (Signed)
Alicia Vaughn, is a 75 y.o. female  DOB [birth year]  MRN [id number].  Admission date:  06/22/2020  Admitting Physician  No admitting provider for patient encounter.  Discharge Date:  06/29/2020   Primary MD  Veverly Fells, MD  Recommendations for primary care physician for things to follow:   1)Very low-salt diet advised 2)Weigh yourself daily, call if you gain more than 3 pounds in 1 day or more than 5 pounds in 1 week as your diuretic medications may need to be adjusted 3)Limit your Fluid  intake to no more than 60 ounces (1.8 Liters) per day 4)TED/Compression stockings--- to be put on every morning and taken off at bedtime 5)Left Hip Staples can be removed in about 8 to 10 days from Now 6)follow up with orthopedic surgeon Dr Dallas Schimke in  approximately 2 weeks postop for incision check and xrays.   7)Repeat CBC and CMP blood test on Monday, 07/03/2020   Admission Diagnosis  Closed fracture of left hip, initial encounter (HCC) [S72.002A] Fall, initial encounter [W19.XXXA] Closed left hip fracture, initial encounter [facility]) [S72.002A]   Discharge Diagnosis  Closed fracture of left hip, initial encounter (HCC) [S72.002A] Fall, initial encounter [W19.XXXA] Closed left hip fracture, initial encounter (HCC) [S72.002A]    Principal Problem:   Closed left hip fracture, initial encounter (HCC) Active Problems:   Bilateral lower extremity edema   Normocytic anemia   COPD (chronic obstructive pulmonary disease) (HCC)   Decreased GFR   Mild protein malnutrition (HCC)   Cardiomegaly      Past Medical History:  Diagnosis Date   COPD (chronic obstructive pulmonary disease) (HCC)     Past Surgical History:  Procedure Laterality Date   BACK SURGERY     INTRAMEDULLARY (IM) NAIL INTERTROCHANTERIC Left 06/23/2020   Procedure: INTRAMEDULLARY (IM) NAIL INTERTROCHANTRIC;  Surgeon: Oliver Barre, MD;  Location: [facility];  Service: Orthopedics;  Laterality: Left;     HPI  from the history and physical done on the day of admission:   Chief Complaint: Fall.   HPI: Alicia Vaughn is a 75 y.o. female with medical history significant of COPD, unspecified liver cirrhosis, stage II lymphedema who is coming to the emergency department due to having an accidental fall from a truck at home landing on her left hip area resulting in immediate pain and inability to stand or bear weight on her LLE.  She denies any type of prodromal symptoms.  She had another fall about a month ago due to lightheadedness, but did not sustain any injuries.  However, she denies chest pain, dizziness, dyspnea, diaphoresis, PND or orthopnea.  She gets frequent lower extremity edema.  She denies abdominal pain, nausea, vomiting, diarrhea, constipation, melena or hematochezia.  No flank pain, dysuria, frequency or hematuria.  Denies polyuria, polydipsia, polyphagia or blurred vision.   ED Course: Initial vital signs were temperature 98.2 F, pulse 103, respirations 22, BP 1 3767 mmHg and O2 sat 97% on room air.  The patient received 75 mcg of fentanyl IVP then  later had 0.5 mg of hydromorphone IVP.   Labwork: CBC showed a white count of 8.5, hemoglobin 11.9 g/dL platelets 093.  PT and INR were normal.  BMP showed normal electrolytes when calcium corrected to albumin.  Glucose 110, BUN 26 and creatinine 1.17 mg/dL.  Hepatic functions showed a total protein of 6.3 and albumin of 3.2 g/dL.   Imaging: Chest radiograph showed mild cardiogenic failure with bilateral pleural effusions and mild interstitial pulmonary edema.  There was mild cardiomegaly.  Superimposed focal pulmonary infiltrate within the mid and left lower lung zone possibly infectious or inflammatory in the acute setting.  Left knee x-rays show extensive lateral soft tissue swelling, but no fracture or dislocation.  Left hip x-rays show an acute, impacted, angulated basicervical left femoral  neck fracture.  Superimposed moderate left hip degenerative arthritis.  Please see images and full radiology report for further detail.    Hospital Course:    Brief Admission History:  75 y.o. female with medical history significant of COPD, unspecified liver cirrhosis, stage II lymphedema who is coming to the emergency department due to having an accidental fall from a truck at home landing on her left hip area resulting in immediate pain and inability to stand or bear weight on her LLE.  She was admitted for operative repair.   Assessment & Plan:   Principal Problem:   Closed left hip fracture, initial encounter [facility]) Active Problems:   Bilateral lower extremity edema   Normocytic anemia   COPD (chronic obstructive pulmonary disease) (HCC)   Decreased GFR   Mild protein malnutrition (HCC)   Cardiomegaly   1)Acute Left Basicervical Femoral Neck Fracture --s/p Cephalomedullary Nail for Left Basicervical Femoral Neck Fracture  on 06/23/20 --Postop mgmt per orthopedics team.  -WBAT Aspirin 81 mg BID started for DVT prophylaxis for 4 to 6 weeks per ortho.   Ortho follow up Plan-Follow up plan: approximately 2 weeks postop for incision check and XR.  If the patient will be discharged to a nursing facility, staples can be removed around this time and a follow up appointment can be scheduled for 6 weeks after surgery. XR at next visit:  please obtain [facility] pelvis, and 2 views of the Left hip PT recommending SNF Rehab   2)Acute on Chronic Normocytic Anemia-stable, --suspect some degree of ABLA -Hemoglobin was above 11 on admission -Hemoglobin currently stable around 10   3)Leukocytosis - ?? Reactive in the postoperative patient -WBC is down to 10.6 from 17.2 -No definite pneumonia on chest x-ray  -Check UA   4)Cardiomegaly with bilateral pleural effusions with compressive atelectasis - Pt with H/o Liver Cirrhosis -Imaging studies without definite pneumonia -Echocardiogram with preserved  EF of 60 to 65% without diastolic dysfunction -Low serum albumin around 3.0 noted  encouraged Incentive spiromentry and ambulation.   -s/p prior thoracentensis as per pt and son  May Need Therapeutic/Palliative Thoracentensis from time to time -Daily weights   5)COPD with mild exacerbation - improved with scheduled bronchodilators and doxycycline -dc to SNF on prednisone    6)Chronic Lymphedema bilateral lower extremities -  Pt with H/o Liver Cirrhosis --Low serum albumin around 3.0 noted elevate legs, TED hose.  -Change Bumex to 2 mg every morning and 1 mg every afternoon, continue Aldactone 200 mg daily Daily weights   7)Mild Protein Calorie Malnutrition-dietitian consultation as part of hip surgery work-up --Low serum albumin around 3.0 noted --Nutritional supplements recommended    Cardiomegaly-2D echocardiogram  IMPRESSIONS   1. Left ventricular ejection fraction,  by estimation, is 60 to 65%. The left ventricle has normal function. The left ventricle has no regional wall motion abnormalities. Left ventricular diastolic parameters were normal.   2. Right ventricular systolic function is normal. The right ventricular size is normal. There is normal pulmonary artery systolic pressure.   3. Moderate pleural effusion in the left lateral region.   4. The mitral valve is abnormal. Trivial mitral valve regurgitation. No evidence of mitral stenosis.   5. The aortic valve is tricuspid. There is mild calcification of the aortic valve. Aortic valve regurgitation is not visualized. Mild aortic valve sclerosis is present, with no evidence of aortic valve stenosis.   6. The inferior vena cava is normal in size with greater than 50% respiratory variability, suggesting right atrial pressure of 3 mmHg.   DVT prophylaxis: Aspirin/TEDs Code Status: Full Family Communication: -At patient's request I called patient's son and discussed her care with her son Disposition:  SNF     Dispo: The patient  is from: Home              Anticipated d/c is to: SNF                 Consultants:  Dr. Dallas Schimkeairns orthopedics   Procedures:  Cephalomedullary nail for left femoral neck fracture 06/23/20  Discharge Condition: stable  Follow UP   Follow-up Information     Oliver Barreairns, Mark A, MD. Schedule an appointment as soon as possible for a visit in 10 day(s).   Specialties: Orthopedic Surgery, Sports Medicine Why: for Lt Hip Post op fu Contact information: [address] [place] [address] [phone]                Diet and Activity recommendation:  As advised  Discharge Instructions      Discharge Instructions     Call MD for:  difficulty breathing, headache or visual disturbances   Complete by: As directed    Call MD for:  persistant dizziness or light-headedness   Complete by: As directed    Call MD for:  persistant nausea and vomiting   Complete by: As directed    Call MD for:  redness, tenderness, or signs of infection (pain, swelling, redness, odor or green/yellow discharge around incision site)   Complete by: As directed    Call MD for:  severe uncontrolled pain   Complete by: As directed    Call MD for:  temperature >100.4   Complete by: As directed    Diet - low sodium heart healthy   Complete by: As directed    Discharge instructions   Complete by: As directed    1)Very low-salt diet advised 2)Weigh yourself daily, call if you gain more than 3 pounds in 1 day or more than 5 pounds in 1 week as your diuretic medications may need to be adjusted 3)Limit your Fluid  intake to no more than 60 ounces (1.8 Liters) per day 4)TED/Compression stockings--- to be put on every morning and taken off at bedtime 5)Left Hip Staples can be removed in about 8 to 10 days from Now 6)follow up with orthopedic surgeon Dr Dallas Schimkeairns in  approximately 2 weeks postop for incision check and xrays.   7) repeat CBC and CMP blood test on Monday, 07/03/2020   Discharge wound care:   Complete by:  As directed    Keep left hip wound dressing dry and intact as advised   Increase activity slowly   Complete by: As directed  Discharge Medications     Allergies as of 06/29/2020       Reactions   Azithromycin    Allergic to all MYCINS   Vancomycin         Medication List     STOP taking these medications    meloxicam 7.5 MG tablet Commonly known as: MOBIC       TAKE these medications    acetaminophen 325 MG tablet Commonly known as: TYLENOL Take 2 tablets (650 mg total) by mouth every 6 (six) hours as needed for mild pain, fever or headache (or Fever >/= 101).   albuterol 108 (90 Base) MCG/ACT inhaler Commonly known as: VENTOLIN HFA Inhale 2 puffs into the lungs every 4 (four) hours as needed for wheezing or shortness of breath (cough).   aspirin 81 MG EC tablet Take 1 tablet (81 mg total) by mouth 2 (two) times daily with a meal. For DVT Prophylaxis   budesonide-formoterol 160-4.5 MCG/ACT inhaler Commonly known as: Symbicort Inhale 2 puffs into the lungs 2 (two) times daily.   bumetanide 1 MG tablet Commonly known as: BUMEX Take 1 tablet (1 mg total) by mouth See admin instructions. Please take 2 mg every morning and 1 mg around 6 PM every evening What changed:  medication strength when to take this additional instructions   doxycycline 100 MG tablet Commonly known as: VIBRA-TABS Take 1 tablet (100 mg total) by mouth 2 (two) times daily for 5 days.   guaiFENesin 600 MG 12 hr tablet Commonly known as: MUCINEX Take 2 tablets (1,200 mg total) by mouth 2 (two) times daily.   guaiFENesin-dextromethorphan 100-10 MG/5ML syrup Commonly known as: ROBITUSSIN DM Take 5 mLs by mouth every 4 (four) hours as needed for cough.   meclizine 25 MG tablet Commonly known as: ANTIVERT Take 25 mg by mouth 3 (three) times daily as needed for dizziness (vertigo).   oxyCODONE 5 MG immediate release tablet Commonly known as: Oxy IR/ROXICODONE Take 1 tablet  (5 mg total) by mouth every 6 (six) hours as needed for breakthrough pain or moderate pain ((for MODERATE breakthrough pain)).   pantoprazole 40 MG tablet Commonly known as: PROTONIX Take 1 tablet (40 mg total) by mouth daily. Start taking on: June 30, 2020   predniSONE 20 MG tablet Commonly known as: DELTASONE Take 2 tablets (40 mg total) by mouth daily with breakfast for 5 days.   senna-docusate 8.6-50 MG tablet Commonly known as: Senokot-S Take 2 tablets by mouth at bedtime.   spironolactone 100 MG tablet Commonly known as: ALDACTONE Take 200 mg by mouth daily.               Discharge Care Instructions  (From admission, onward)           Start     Ordered   06/29/20 0000  Discharge wound care:       Comments: Keep left hip wound dressing dry and intact as advised   06/29/20 1243            Major procedures and Radiology Reports - PLEASE review detailed and final reports for all details, in brief -    DG Chest 1 View  Result Date: 06/22/2020 CLINICAL DATA:  Fall, chest injury EXAM: CHEST  1 VIEW COMPARISON:  None. FINDINGS: Small to moderate left and tiny right pleural effusions are present. There is left basilar atelectasis, however, there is superimposed left mid and lower lung zone focal pulmonary infiltrate, possibly infectious or inflammatory in the acute  setting. Trace superimposed interstitial pulmonary edema, likely cardiogenic in nature. Cardiac size is mildly enlarged. No acute bone abnormality. IMPRESSION: Mild cardiogenic failure with bilateral pleural effusions and mild interstitial pulmonary edema. Mild cardiomegaly. Superimposed focal pulmonary infiltrate within the left mid and lower lung zone, possibly infectious or inflammatory in the acute setting. Electronically Signed   By: Helyn Numbers MD   On: 06/22/2020 21:52   DG Pelvis Portable  Result Date: 06/23/2020 CLINICAL DATA:  Postop left hip fracture. EXAM: PORTABLE PELVIS 1-2 VIEWS  COMPARISON:  06/22/2020 FINDINGS: Left hip fixation hardware in good position. Reduction of the intertrochanteric fracture of the left hip. The right hip is normally located. No pelvic fractures are identified. IMPRESSION: Internal fixation of the left hip fracture with good position/alignment. Electronically Signed   By: Rudie Meyer M.D.   On: 06/23/2020 15:15   DG CHEST PORT 1 VIEW  Result Date: 06/27/2020 CLINICAL DATA:  Cough this morning, COPD, former smoker, recent hip surgery EXAM: PORTABLE CHEST 1 VIEW COMPARISON:  Portable exam 0808 hours compared to 06/24/2020 FINDINGS: Normal heart size, mediastinal contours, and pulmonary vascularity. Bibasilar atelectasis and pleural effusions unchanged. Upper lungs clear. Emphysematous changes consistent with history of COPD. Skin folds project over RIGHT lung. No pneumothorax. Bones demineralized with evidence of prior thoracolumbar fusion. IMPRESSION: Persistent bibasilar pleural effusions and atelectasis. Emphysema (ICD10-J43.9). Electronically Signed   By: Ulyses Southward M.D.   On: 06/27/2020 08:39   DG CHEST PORT 1 VIEW  Result Date: 06/24/2020 CLINICAL DATA:  Cough. EXAM: PORTABLE CHEST 1 VIEW COMPARISON:  Two days ago FINDINGS: Hazy lower chest opacity with bilateral small to moderate pleural effusion. There has been increased pleural fluid on the right at least. Cardiomegaly which is partially obscured. No pneumothorax. Large lung volumes with known COPD. IMPRESSION: Increased pleural fluid and lower lobe opacity. Electronically Signed   By: Marnee Spring M.D.   On: 06/24/2020 08:08   DG Knee Complete 4 Views Left  Result Date: 06/22/2020 CLINICAL DATA:  Left knee pain EXAM: LEFT KNEE - COMPLETE 4+ VIEW COMPARISON:  None. FINDINGS: Four view radiograph left knee demonstrates normal alignment. No fracture or dislocation. Joint spaces are preserved. No effusion. There is extensive subcutaneous edema involving the lateral soft tissues of the distal  thigh and proximal left foreleg. IMPRESSION: Extensive lateral soft tissue swelling.  No fracture or dislocation. Electronically Signed   By: Helyn Numbers MD   On: 06/22/2020 21:54   ECHOCARDIOGRAM COMPLETE  Result Date: 06/24/2020    ECHOCARDIOGRAM REPORT   Patient Name:   WILHEMINA GRALL Date of Exam: 06/24/2020 Medical Rec #:  [id number]     Height:       59.0 in Accession #:    [id number]    Weight:       130.0 lb Date of Birth:  November 07, 1945      BSA:          1.536 m Patient Age:    75 years      BP:           97/55 mmHg Patient Gender: F             HR:           92 bpm. [facility] Procedure: 2D Echo, Cardiac Doppler and Color Doppler Indications:    Cardiomegaly  History:        Patient has no prior history of Echocardiogram examinations.  COPD; Risk Factors:Former Smoker. Post-op Left hip                 fracture,pleural effusion.  Sonographer:    Lavenia Atlas RDCS Referring Phys: [id number] MARK A CAIRNS IMPRESSIONS  1. Left ventricular ejection fraction, by estimation, is 60 to 65%. The left ventricle has normal function. The left ventricle has no regional wall motion abnormalities. Left ventricular diastolic parameters were normal.  2. Right ventricular systolic function is normal. The right ventricular size is normal. There is normal pulmonary artery systolic pressure.  3. Moderate pleural effusion in the left lateral region.  4. The mitral valve is abnormal. Trivial mitral valve regurgitation. No evidence of mitral stenosis.  5. The aortic valve is tricuspid. There is mild calcification of the aortic valve. Aortic valve regurgitation is not visualized. Mild aortic valve sclerosis is present, with no evidence of aortic valve stenosis.  6. The inferior vena cava is normal in size with greater than 50% respiratory variability, suggesting right atrial pressure of 3 mmHg. FINDINGS  Left Ventricle: Left ventricular ejection fraction, by estimation, is 60 to 65%. The left  ventricle has normal function. The left ventricle has no regional wall motion abnormalities. The left ventricular internal cavity size was normal in size. There is  no left ventricular hypertrophy. Left ventricular diastolic parameters were normal. Right Ventricle: The right ventricular size is normal. No increase in right ventricular wall thickness. Right ventricular systolic function is normal. There is normal pulmonary artery systolic pressure. The tricuspid regurgitant velocity is 2.81 m/s, and  with an assumed right atrial pressure of 3 mmHg, the estimated right ventricular systolic pressure is 34.6 mmHg. Left Atrium: Left atrial size was normal in size. Right Atrium: Right atrial size was normal in size. Pericardium: There is no evidence of pericardial effusion. Mitral Valve: The mitral valve is abnormal. There is mild thickening of the mitral valve leaflet(s). Trivial mitral valve regurgitation. No evidence of mitral valve stenosis. Tricuspid Valve: The tricuspid valve is normal in structure. Tricuspid valve regurgitation is mild . No evidence of tricuspid stenosis. Aortic Valve: The aortic valve is tricuspid. There is mild calcification of the aortic valve. Aortic valve regurgitation is not visualized. Mild aortic valve sclerosis is present, with no evidence of aortic valve stenosis. Pulmonic Valve: The pulmonic valve was normal in structure. Pulmonic valve regurgitation is not visualized. No evidence of pulmonic stenosis. Aorta: The aortic root is normal in size and structure. Venous: The inferior vena cava is normal in size with greater than 50% respiratory variability, suggesting right atrial pressure of 3 mmHg. IAS/Shunts: No atrial level shunt detected by color flow Doppler. Additional Comments: There is a moderate pleural effusion in the left lateral region.  LEFT VENTRICLE PLAX 2D LVIDd:         3.84 cm     Diastology LVIDs:         2.16 cm     LV e' medial:    10.10 cm/s LV PW:         0.93 cm     LV  E/e' medial:  8.8 LV IVS:        0.83 cm     LV e' lateral:   10.90 cm/s LVOT diam:     2.10 cm     LV E/e' lateral: 8.1 LV SV:         91 LV SV Index:   59 LVOT Area:     3.46 cm  LV Volumes (MOD) LV  vol d, MOD A4C: 47.2 ml LV vol s, MOD A4C: 13.0 ml LV SV MOD A4C:     47.2 ml RIGHT VENTRICLE RV Basal diam:  3.37 cm RV S prime:     8.59 cm/s TAPSE (M-mode): 2.2 cm LEFT ATRIUM             Index       RIGHT ATRIUM           Index LA diam:        2.70 cm 1.76 cm/m  RA Area:     12.20 cm LA Vol (A2C):   33.7 ml 21.94 ml/m RA Volume:   28.40 ml  18.49 ml/m LA Vol (A4C):   39.1 ml 25.46 ml/m LA Biplane Vol: 37.3 ml 24.29 ml/m  AORTIC VALVE LVOT Vmax:   137.00 cm/s LVOT Vmean:  90.400 cm/s LVOT VTI:    0.262 m  AORTA Ao Root diam: 2.80 cm MITRAL VALVE                TRICUSPID VALVE MV Area (PHT): 6.60 cm     TR Peak grad:   31.6 mmHg MV Decel Time: 115 msec     TR Vmax:        281.00 cm/s MV E velocity: 88.50 cm/s MV A velocity: 107.00 cm/s  SHUNTS MV E/A ratio:  0.83         Systemic VTI:  0.26 m                             Systemic Diam: 2.10 cm Charlton Haws MD Electronically signed by Charlton Haws MD Signature Date/Time: 06/24/2020/11:43:23 AM    Final    DG HIP OPERATIVE UNILAT WITH PELVIS LEFT  Result Date: 06/23/2020 CLINICAL DATA:  Left hip fracture. EXAM: OPERATIVE left HIP (WITH PELVIS IF PERFORMED) 11 VIEWS TECHNIQUE: Fluoroscopic spot image(s) were submitted for interpretation post-operatively. COMPARISON:  Radiograph 06/22/2020 FINDINGS: Multiple fluoroscopic spot images demonstrate placement of a intramedullary gamma nail in the femur, a proximal dynamic hip screw and the single mid femur interlocking screw. Good position and alignment of the intertrochanteric fracture without complicating features. IMPRESSION: Internal fixation of the intertrochanteric fracture. Electronically Signed   By: Rudie Meyer M.D.   On: 06/23/2020 15:16   DG Hip Unilat With Pelvis 2-3 Views Left  Result Date:  06/22/2020 CLINICAL DATA:  Fall, left hip pain EXAM: DG HIP (WITH OR WITHOUT PELVIS) 2-3V LEFT COMPARISON:  None FINDINGS: Single view radiograph pelvis and two view radiograph left hip demonstrates an acute, impacted basicervical femoral neck fracture. The fracture extends into the superior aspect of the lesser trochanter, however, this does not appear avulsed from the a distal fracture fragment consisting of the femoral shaft. Moderate varus and mild dorsal angulation. Femoral head is still seated within the left acetabulum. At least mild to moderate superimposed left hip degenerative arthritis with joint space narrowing. The pelvis and right hip are intact. Mild right hip degenerative arthritis. IMPRESSION: Acute, impacted, angulated basicervical left femoral neck fracture. Superimposed moderate left hip degenerative arthritis. Electronically Signed   By: Helyn Numbers MD   On: 06/22/2020 21:49   DG FEMUR PORT MIN 2 VIEWS LEFT  Result Date: 06/23/2020 CLINICAL DATA:  Postop left hip fracture fixation. EXAM: LEFT FEMUR PORTABLE 2 VIEWS COMPARISON:  Radiographs 06/22/2020 FINDINGS: There is a long intramedullary gamma nail in the femur with 1 mid interlocking screw. There is also a  proximal dynamic hip screw in good position. Good position and alignment of the intertrochanteric fracture. IMPRESSION: Internal fixation of intertrochanteric fracture. Good position and alignment. Electronically Signed   By: Rudie Meyer M.D.   On: 06/23/2020 15:13    Micro Results   Recent Results (from the past 240 hour(s))  Resp Panel by RT-PCR (Flu A&B, Covid) Nasopharyngeal Swab     Status: None   Collection Time: 06/22/20  9:39 PM   Specimen: Nasopharyngeal Swab; Nasopharyngeal(NP) swabs in vial transport medium  Result Value Ref Range Status   SARS Coronavirus 2 by RT PCR NEGATIVE NEGATIVE Final    Comment: (NOTE) SARS-CoV-2 target nucleic acids are NOT DETECTED.  The SARS-CoV-2 RNA is generally detectable in  upper respiratory specimens during the acute phase of infection. The lowest concentration of SARS-CoV-2 viral copies this assay can detect is 138 copies/mL. A negative result does not preclude SARS-Cov-2 infection and should not be used as the sole basis for treatment or other patient management decisions. A negative result may occur with  improper specimen collection/handling, submission of specimen other than nasopharyngeal swab, presence of viral mutation(s) within the areas targeted by this assay, and inadequate number of viral copies(<138 copies/mL). A negative result must be combined with clinical observations, patient history, and epidemiological information. The expected result is Negative.  Fact Sheet for Patients:  BloggerCourse.com  Fact Sheet for Healthcare Providers:  SeriousBroker.it  This test is no t yet approved or cleared by the [place] FDA and  has been authorized for detection and/or diagnosis of SARS-CoV-2 by FDA under an Emergency Use Authorization (EUA). This EUA will remain  in effect (meaning this test can be used) for the duration of the COVID-19 declaration under Section 564(b)(1) of the Act, 21 U.S.C.section 360bbb-3(b)(1), unless the authorization is terminated  or revoked sooner.       Influenza A by PCR NEGATIVE NEGATIVE Final   Influenza B by PCR NEGATIVE NEGATIVE Final    Comment: (NOTE) The Xpert Xpress SARS-CoV-2/FLU/RSV plus assay is intended as an aid in the diagnosis of influenza from Nasopharyngeal swab specimens and should not be used as a sole basis for treatment. Nasal washings and aspirates are unacceptable for Xpert Xpress SARS-CoV-2/FLU/RSV testing.  Fact Sheet for Patients: BloggerCourse.com  Fact Sheet for Healthcare Providers: SeriousBroker.it  This test is not yet approved or cleared by the [place] FDA and has been  authorized for detection and/or diagnosis of SARS-CoV-2 by FDA under an Emergency Use Authorization (EUA). This EUA will remain in effect (meaning this test can be used) for the duration of the COVID-19 declaration under Section 564(b)(1) of the Act, 21 U.S.C. section 360bbb-3(b)(1), unless the authorization is terminated or revoked.  Performed at [facility], [address] [place], [place] [address]   Surgical PCR screen     Status: None   Collection Time: 06/23/20  2:23 AM   Specimen: Nasal Mucosa; Nasal Swab  Result Value Ref Range Status   MRSA, PCR NEGATIVE NEGATIVE Final   Staphylococcus aureus NEGATIVE NEGATIVE Final    Comment: (NOTE) The Xpert SA Assay (FDA approved for NASAL specimens in patients 31 years of age and older), is one component of a comprehensive surveillance program. It is not intended to diagnose infection nor to guide or monitor treatment. Performed at [facility], [address] [place], [place] [address]   Resp Panel by RT-PCR (Flu A&B, Covid) Nasopharyngeal Swab     Status: None   Collection Time: 06/29/20 11:05  AM   Specimen: Nasopharyngeal Swab; Nasopharyngeal(NP) swabs in vial transport medium  Result Value Ref Range Status   SARS Coronavirus 2 by RT PCR NEGATIVE NEGATIVE Final    Comment: (NOTE) SARS-CoV-2 target nucleic acids are NOT DETECTED.  The SARS-CoV-2 RNA is generally detectable in upper respiratory specimens during the acute phase of infection. The lowest concentration of SARS-CoV-2 viral copies this assay can detect is 138 copies/mL. A negative result does not preclude SARS-Cov-2 infection and should not be used as the sole basis for treatment or other patient management decisions. A negative result may occur with  improper specimen collection/handling, submission of specimen other than nasopharyngeal swab, presence of viral mutation(s) within the areas targeted by this assay, and inadequate number of viral copies(<138 copies/mL).  A negative result must be combined with clinical observations, patient history, and epidemiological information. The expected result is Negative.  Fact Sheet for Patients:  BloggerCourse.com  Fact Sheet for Healthcare Providers:  SeriousBroker.it  This test is no t yet approved or cleared by the [place] FDA and  has been authorized for detection and/or diagnosis of SARS-CoV-2 by FDA under an Emergency Use Authorization (EUA). This EUA will remain  in effect (meaning this test can be used) for the duration of the COVID-19 declaration under Section 564(b)(1) of the Act, 21 U.S.C.section 360bbb-3(b)(1), unless the authorization is terminated  or revoked sooner.       Influenza A by PCR NEGATIVE NEGATIVE Final   Influenza B by PCR NEGATIVE NEGATIVE Final    Comment: (NOTE) The Xpert Xpress SARS-CoV-2/FLU/RSV plus assay is intended as an aid in the diagnosis of influenza from Nasopharyngeal swab specimens and should not be used as a sole basis for treatment. Nasal washings and aspirates are unacceptable for Xpert Xpress SARS-CoV-2/FLU/RSV testing.  Fact Sheet for Patients: BloggerCourse.com  Fact Sheet for Healthcare Providers: SeriousBroker.it  This test is not yet approved or cleared by the [place] FDA and has been authorized for detection and/or diagnosis of SARS-CoV-2 by FDA under an Emergency Use Authorization (EUA). This EUA will remain in effect (meaning this test can be used) for the duration of the COVID-19 declaration under Section 564(b)(1) of the Act, 21 U.S.C. section 360bbb-3(b)(1), unless the authorization is terminated or revoked.  Performed at [facility], [address], [place], [place] [address]        Today   Subjective    Alicia Vaughn today has no new concerns  No fever  Or chills   No Nausea, Vomiting or Diarrhea -- Voiding well         -Ambulated with occupational therapist,  O2 sats 91 to 93% on room air   Patient has been seen and examined prior to discharge   Objective   Blood pressure 114/67, pulse 79, temperature 98.4 F (36.9 C), resp. rate 18, height 4\' 11"  (1.499 m), weight 59 kg, SpO2 (!) 88 %.   Intake/Output Summary (Last 24 hours) at 06/29/2020 1244 Last data filed at 06/29/2020 1200 Gross per 24 hour  Intake 1018.29 ml  Output 700 ml  Net 318.29 ml    Exam Physical Exam Gen:- Awake Alert,  in no apparent distress  HEENT:- [place].AT, No sclera icterus Neck-Supple Neck,No JVD,. Lungs-  diminished in bases, no wheezing or Rhonchi CV- S1, S2 normal Abd-  +ve B.Sounds, Abd Soft, No tenderness,    Extremity/Skin:- 2+ pitting edema, +pulses Psych-affect is appropriate, oriented x3 Neuro-Generalized weakness, no new focal deficits, no tremors MSK- Lt Hip post-op  wound is C/D/I   Data Review   CBC w Diff:  Lab Results  Component Value Date   WBC 10.6 (H) 06/29/2020   HGB 9.8 (L) 06/29/2020   HCT 31.3 (L) 06/29/2020   PLT 366 06/29/2020   LYMPHOPCT 4 06/28/2020   MONOPCT 11 06/28/2020   EOSPCT 1 06/28/2020   BASOPCT 1 06/28/2020    CMP:  Lab Results  Component Value Date   NA 136 06/28/2020   K 3.6 06/28/2020   CL 93 (L) 06/28/2020   CO2 34 (H) 06/28/2020   BUN 28 (H) 06/28/2020   CREATININE 0.90 06/28/2020   PROT 5.9 (L) 06/23/2020   ALBUMIN 3.1 (L) 06/23/2020   BILITOT 0.4 06/23/2020   ALKPHOS 68 06/23/2020   AST 15 06/23/2020   ALT 14 06/23/2020  . Total Discharge time is about 33 minutes  Shon Hale M.D on 06/29/2020 at 12:44 PM  Go to [email or web address] -  for contact info  [facility] - Office  [phone]

## 2020-06-29 NOTE — Discharge Instructions (Signed)
1)Very low-salt diet advised 2)Weigh yourself daily, call if you gain more than 3 pounds in 1 day or more than 5 pounds in 1 week as your diuretic medications may need to be adjusted 3)Limit your Fluid  intake to no more than 60 ounces (1.8 Liters) per day 4)TED/Compression stockings--- to be put on every morning and taken off at bedtime 5)Left Hip Staples can be removed in about 8 to 10 days from Now 6)follow up with orthopedic surgeon Dr Dallas Schimke in  approximately 2 weeks postop for incision check and xrays.   7) repeat CBC and CMP blood test on Monday, 07/03/2020

## 2020-06-29 NOTE — Progress Notes (Signed)
Occupational Therapy Treatment Patient Details Name: Alicia Vaughn MRN: [id number] DOB: 1945-06-05 Today's Date: 06/29/2020    History of present illness Alicia Vaughn is a 75 y.o. female s/p ORIF Left hip fracture on 06/23/20  with medical history significant of COPD, unspecified liver cirrhosis, stage II lymphedema who is coming to the emergency department due to having an accidental fall from a truck at home landing on her left hip area resulting in immediate pain and inability to stand or bear weight on her LLE.  She denies any type of prodromal symptoms.  She had another fall about a month ago due to lightheadedness, but did not sustain any injuries.  However, she denies chest pain, dizziness, dyspnea, diaphoresis, PND or orthopnea.  She gets frequent lower extremity edema.  She denies abdominal pain, nausea, vomiting, diarrhea, constipation, melena or hematochezia.  No flank pain, dysuria, frequency or hematuria.  Denies polyuria, polydipsia, polyphagia or blurred vision.   OT comments  Pt agreeable to OT treatment this date. Obtaining medications from nursing upon arrival to session. Pt SpO2 levels while pt on room air monitored during bed mobility, sit to stands, and stand pivot transfers. With pt supine in bed with HOB elevated SpO2 was 91%. During bed mobility and transfers SpO2 levels reached a low of 89 % for a few seconds. Pulse oximeter was adjusted and levels quickly rose to ~91 % and above. Pt required min to mod A for supine to sit bed mobility and Mod A for x4 sit to stand attempts and Mod A for stand pivot transfer from EOB to chair using RW. Total assist required again this date for donning socks at bed level. Pt will benefit from continued OT services in the hospital setting to address deficit areas prior to d/c.   Follow Up Recommendations  SNF;Supervision - Intermittent    Equipment Recommendations  None recommended by OT          Precautions / Restrictions  Precautions Precautions: Fall Restrictions Weight Bearing Restrictions: Yes LLE Weight Bearing: Weight bearing as tolerated       Mobility Bed Mobility Overal bed mobility: Needs Assistance Bed Mobility: Supine to Sit;Sit to Supine     Supine to sit: Min assist;Mod assist     General bed mobility comments: increased time, labored movement, requires assistance for LE movement in bed, HOB elevated    Transfers Overall transfer level: Needs assistance Equipment used: Rolling walker (2 wheeled) Transfers: Sit to/from [employer] Sit to Stand: Mod assist Stand pivot transfers: Mod assist       General transfer comment: slow labored movement    Balance Overall balance assessment: Needs assistance Sitting-balance support: Feet supported;No upper extremity supported Sitting balance-Leahy Scale: Good Sitting balance - Comments: seated EOB   Standing balance support: During functional activity;Bilateral upper extremity supported Standing balance-Leahy Scale: Poor Standing balance comment: using RW                           ADL either performed or assessed with clinical judgement   ADL Overall ADL's : Needs assistance/impaired                 Upper Body Dressing : Total assistance;Bed level Upper Body Dressing Details (indicate cue type and reason): total assist to don socks at bed level  Cognition Arousal/Alertness: Awake/alert Behavior During Therapy: WFL for tasks assessed/performed Overall Cognitive Status: Within Functional Limits for tasks assessed                                          Exercises Exercises: Other exercises (Pt complted total of x4 sit to stands x3 from chair and one from EOB using RW. Mod A throught attempts. Monitoring SpO2 levels.)                Pertinent Vitals/ Pain       Pain Assessment: 0-10 Pain Score: 5  Pain Location:  L hip (reported while pt supine in bed; per faces scale during movement pt near 7/10) Pain Descriptors / Indicators: Discomfort;Grimacing;Guarding Pain Intervention(s): Limited activity within patient's tolerance;Monitored during session;Repositioned;Premedicated before session                                                          Frequency  Min 2X/week        Progress Toward Goals  OT Goals(current goals can now be found in the care plan section)  Progress towards OT goals: Progressing toward goals  Acute Rehab OT Goals Patient Stated Goal: get rehab OT Goal Formulation: With patient Time For Goal Achievement: 07/11/20 Potential to Achieve Goals: Good ADL Goals Pt Will Perform Grooming: standing;with min guard assist;with min assist;with adaptive equipment Pt Will Perform Lower Body Dressing: with min assist;sitting/lateral leans;with adaptive equipment Pt Will Transfer to Toilet: with min guard assist;with supervision;bedside commode;stand pivot transfer Pt/caregiver will Perform Home Exercise Program: Increased strength;Both right and left upper extremity;Independently  Plan Discharge plan remains appropriate                                    End of Session Equipment Utilized During Treatment: Rolling walker;Oxygen  OT Visit Diagnosis: Unsteadiness on feet (R26.81);Repeated falls (R29.6);History of falling (Z91.81);Muscle weakness (generalized) (M62.81);Pain Pain - Right/Left: Left Pain - part of body: Hip;Knee   Activity Tolerance Patient tolerated treatment well   Patient Left in chair;with chair alarm set;Other (comment) (nurse and doctor present in room)   Nurse Communication Other (comment) (SpO2 levels with pt on room air during mobility.)        Time: 9379-0240 OT Time Calculation (min): 27 min  Charges: OT General Charges $OT Visit: 1 Visit OT Treatments $Self Care/Home Management : 23-37 mins  Israel Werts OT, MOT    Danie Chandler 06/29/2020, 11:45 AM

## 2020-06-29 NOTE — TOC Transition Note (Signed)
Transition of Care [facility]) - CM/SW Discharge Note   Patient Details  Name: Alicia Vaughn MRN: [id number] Date of Birth: November 11, 1945  [facility] CM/SW Contact:  Barry Brunner, LCSW Phone Number: 06/29/2020, 2:31 PM   Clinical Narrative:    CSW notified of patient's readiness for discharge. CSW faxed Covid Test, FL2 and discharge summary. CSW added facility to [place]. Steward Drone with [facility] SNF agreeable to take patient. CSW completed med necessity and called [facility] with [facility]. [facility] not able to transport out of county. Nurse agreeable to call report. TOC signing off.    Final next level of care: Skilled Nursing Facility Barriers to Discharge: Barriers Resolved   Patient Goals and CMS Choice Patient states their goals for this hospitalization and ongoing recovery are:: Rehab with SNF CMS Medicare.gov Compare Post Acute Care list provided to:: Patient Choice offered to / list presented to : Patient  Discharge Placement                Patient to be transferred to facility by: [facility]` Name of family member notified: Hilaria Ota Patient and family notified of of transfer: 06/29/20  Discharge Plan and Services     Post Acute Care Choice: Skilled Nursing Facility                               Social Determinants of Health (SDOH) Interventions     Readmission Risk Interventions No flowsheet data found.

## 2020-07-04 ENCOUNTER — Telehealth: Payer: Self-pay | Admitting: Orthopedic Surgery

## 2020-07-04 NOTE — Telephone Encounter (Signed)
Called and verbal order left on VM with call back information in case of questions.

## 2020-07-04 NOTE — Telephone Encounter (Signed)
I received a staff message this morning from Dr. Dallas Schimke regarding scheduling this patient for an appointment 2 weeks post op.  I then spoke to Dr Dallas Schimke and he said that if the facility where she is staying could remove the sutures, then he could see her in 6 weeks.  I called and left a message at [facility] to call me regarding this patient.  Marcial Pacas returned my call.  She said that yes that could remove this patient's sutures with an order from Dr. Dallas Schimke.    I have given an appointment to follow up with Dr. Dallas Schimke on Tuesday, 08/08/20 at 10:00  Could you fax a signed order for suture removal and any other doctor orders to [facility] Attn: Marcial Pacas?  The phone number to this facility is [phone]  and fax number is [phone]  Thanks so much

## 2020-07-18 ENCOUNTER — Telehealth: Payer: Self-pay | Admitting: Orthopedic Surgery

## 2020-07-18 NOTE — Telephone Encounter (Signed)
Call received from [facility], [facility], ph# [phone], due to not reaching patient to start care today, requesting order to delay care until tomorrow. We re-verified phone numbers, as we have found that the home number that their office had tried was not the number on file. Please advise.

## 2020-07-19 ENCOUNTER — Telehealth: Payer: Self-pay | Admitting: Orthopedic Surgery

## 2020-07-19 NOTE — Telephone Encounter (Signed)
Spoke with Lorene Dy and order given to continue home nursing care.

## 2020-07-19 NOTE — Telephone Encounter (Signed)
Attempted to call Christi, left VM for call back.

## 2020-07-19 NOTE — Telephone Encounter (Signed)
[facility] Lorene Dy calling for verbal orders; states did the initial evaluation today, 07/19/20. Please advise.

## 2020-07-26 ENCOUNTER — Telehealth: Payer: Self-pay | Admitting: Orthopedic Surgery

## 2020-07-26 NOTE — Telephone Encounter (Signed)
Call received via voice message from Lanae Boast, physical therapist with [facility], [phone], requesting verbal orders for frequency of physical therapy visits:  1 week x1 /  2 weeks x4 / 1 week x2 Also requesting orders for occupational therapy for 1 week x1, 1 month x1  - states if not available to answer at her above direct phone, voice mail is secure - please leave voice message.

## 2020-08-08 ENCOUNTER — Ambulatory Visit (INDEPENDENT_AMBULATORY_CARE_PROVIDER_SITE_OTHER): Payer: Medicare Other | Admitting: Orthopedic Surgery

## 2020-08-08 ENCOUNTER — Encounter: Payer: Self-pay | Admitting: Orthopedic Surgery

## 2020-08-08 ENCOUNTER — Other Ambulatory Visit: Payer: Self-pay

## 2020-08-08 ENCOUNTER — Ambulatory Visit: Payer: Medicare Other

## 2020-08-08 VITALS — Ht 59.0 in | Wt 130.0 lb

## 2020-08-08 DIAGNOSIS — S72002D Fracture of unspecified part of neck of left femur, subsequent encounter for closed fracture with routine healing: Secondary | ICD-10-CM

## 2020-08-08 NOTE — Progress Notes (Signed)
.  Orthopaedic Postop Note  Assessment: Alicia Vaughn is a 75 y.o. female s/p cephalomedullary nail for left intertrochanteric femur fracture   DOS: 06/23/20  Plan: Cherlynn Polo previously removed, while in SNF.  Incision healing well, no dressing needed.  WBAT on left leg.  Continue to work with home health physical therapy.  Agree with protected ambulation with a walker, transition to a cane.   Pain is well controlled, OTC medications as needed.  Follow up in 6 weeks   Follow-up: Return in about 6 weeks (around 09/19/2020). XR at next visit: AP pelvis, L femur  Subjective:  Chief Complaint  Patient presents with   Routine Post Op    Lt hip DOS 06/23/20    History of Present Illness: Alicia Vaughn is a 75 y.o. female who presents following the above stated procedure.  Surgery was 6 weeks ago.  She was discharged to a nursing facility, where her staples were removed.  She has been home for the past 2-3 weeks.  She is working with [facility] PT.  Pain is improving.  She is not relying on medications consistently.  Her strength and mobility is gradually improving.  No issues with her surgical incisions.   Review of Systems: No fevers or chills No numbness or tingling No Chest Pain No shortness of breath   Objective: Ht 4\' 11"  (1.499 m)   Wt 130 lb (59 kg)   BMI 26.26 kg/m   Physical Exam:  Alert and oriented.  No acute distress  She is able to stand with some assistance, but requires a lot of time.  Lateral hip and leg incisions are healing well.  No surrounding erythema or drainage.  She is able to maintain a straight leg raise.  Active dorsiflexion of the ankle and great toe.  No pain with axial loading.  She tolerates gentle range of motion of the hip.   IMAGING: I personally ordered and reviewed the following images:  XR of the pelvis and the left hip were obtained in clinic today.  The intertrochanteric fracture remains in good alignment.  There is no evidence of hardware failure  or loosening.    Impression: left hip fracture in good alignment without hardware failure   , MD 08/08/2020 9:17 PM

## 2020-09-12 ENCOUNTER — Telehealth: Payer: Self-pay | Admitting: Orthopedic Surgery

## 2020-09-12 NOTE — Telephone Encounter (Signed)
Call received from [facility], per Nicholos Johns, asking for verbal orders to continue physical therapy. Ph# [phone]

## 2020-09-13 NOTE — Telephone Encounter (Signed)
Left VM with verbal order  

## 2020-09-19 ENCOUNTER — Encounter: Payer: Medicare Other | Admitting: Orthopedic Surgery

## 2020-09-27 ENCOUNTER — Ambulatory Visit: Payer: Medicare Other

## 2020-09-27 ENCOUNTER — Other Ambulatory Visit: Payer: Self-pay

## 2020-09-27 ENCOUNTER — Encounter: Payer: Self-pay | Admitting: Orthopedic Surgery

## 2020-09-27 ENCOUNTER — Ambulatory Visit (INDEPENDENT_AMBULATORY_CARE_PROVIDER_SITE_OTHER): Payer: Medicare Other | Admitting: Orthopedic Surgery

## 2020-09-27 VITALS — BP 101/57 | HR 88 | Ht 59.0 in | Wt 128.0 lb

## 2020-09-27 DIAGNOSIS — S72002D Fracture of unspecified part of neck of left femur, subsequent encounter for closed fracture with routine healing: Secondary | ICD-10-CM

## 2020-09-27 NOTE — Progress Notes (Signed)
.  Orthopaedic Postop Note  Assessment: Alicia Vaughn is a 75 y.o. female s/p cephalomedullary nail for left intertrochanteric femur fracture   DOS: 06/23/20  Plan: Alicia Vaughn continues to improve following surgery.  She is ambulating well with the use of a walker.  She has had some setbacks during her recovery due to other illnesses, but is feeling much better, and feeling stronger.  Continue to use an assistive device when ambulating, and anticipate progressive improvements.  If something changes, have asked her to return to clinic.  Otherwise, I will see her back in approximately 3 months.   Follow-up: Return in about 3 months (around 12/27/2020). XR at next visit: AP pelvis, L femur  Subjective:  Chief Complaint  Patient presents with   Routine Post Op    Lt hip DOS 06/23/20    History of Present Illness: Alicia Vaughn is a 75 y.o. female who presents following the above stated procedure.  Surgery was 3 months ago.  She continues to work with physical therapy.  She is working on transitioning from the walker to a cane.  She denies pain in her left groin.  No issues with her surgical incisions.  She is pleased with her progress today.  Review of Systems: No fevers or chills No numbness or tingling No Chest Pain No shortness of breath   Objective: BP (!) 101/57   Pulse 88   Ht 4\' 11"  (1.499 m)   Wt 128 lb (58.1 kg)   BMI 25.85 kg/m   Physical Exam:  Alert and oriented.  No acute distress  Steady gait, using a walker to assist with ambulation.  Lateral hip incisions are healing well.  No surrounding erythema or drainage.  She tolerates gentle range of motion of the left hip.  She able to maintain straight leg raise.  Sensation is intact in the left foot.  Toes are warm and well-perfused.  IMAGING: I personally ordered and reviewed the following images:  AP pelvis and left femur x-rays were obtained in clinic today.  These demonstrate excellent alignment of the  intertrochanteric femur fracture.  Interval consolidation at the fracture site.  No displacement.  No evidence of hardware failure or loosening.  No hardware cut out.  Impression: Healing left intertrochanteric femur fracture with stable appearing hardware  , MD 09/28/2020 8:56 AM

## 2020-09-28 ENCOUNTER — Encounter: Payer: Self-pay | Admitting: Orthopedic Surgery

## 2020-12-27 ENCOUNTER — Ambulatory Visit: Payer: Medicare Other

## 2020-12-27 ENCOUNTER — Other Ambulatory Visit: Payer: Self-pay

## 2020-12-27 ENCOUNTER — Encounter: Payer: Self-pay | Admitting: Orthopedic Surgery

## 2020-12-27 ENCOUNTER — Ambulatory Visit: Payer: Medicare Other | Admitting: Orthopedic Surgery

## 2020-12-27 VITALS — Ht 59.0 in | Wt 128.0 lb

## 2020-12-27 DIAGNOSIS — S72002D Fracture of unspecified part of neck of left femur, subsequent encounter for closed fracture with routine healing: Secondary | ICD-10-CM | POA: Diagnosis not present

## 2020-12-27 NOTE — Progress Notes (Signed)
.  Orthopaedic Postop Note  Assessment: Alicia Vaughn is a 75 y.o. female s/p cephalomedullary nail for left intertrochanteric femur fracture   DOS: 06/23/20  Plan: [name] is doing very well following surgery.  She is ambulating with the assistance of a cane.  She describes some weakness, but has good strength on physical exam today.  I am very pleased with her recovery.  Radiographs look very good.  This was discussed with the patient.  We will see her back in approximately 6 months, which is about the 1 year anniversary of surgery.  All questions were answered, and she is amenable to this plan.   Follow-up: No follow-ups on file. XR at next visit: AP pelvis, L femur  Subjective:  Chief Complaint  Patient presents with   Routine Post Op    Lt hip DOS 06/23/20    History of Present Illness: Alicia Vaughn is a 76 y.o. female who presents following the above stated procedure.  Surgery was approximately 6 months ago, and she is doing well.  She using a cane to assist with ambulation.  She uses the cane due to some relative weakness, and general instability.  She denies pain.  No numbness or tingling.  Denies fevers or chills.  No issues with her surgical incisions.   Review of Systems: No fevers or chills No numbness or tingling No Chest Pain No shortness of breath   Objective: Ht 4\' 11"  (1.499 m)    Wt 128 lb (58.1 kg)    BMI 25.85 kg/m   Physical Exam:  Alert and oriented.  No acute distress  Steady gait, using a cane To assist with ambulation.  left lateral hip incisions are healing well.  No surrounding erythema or drainage.  No tenderness to palpation.  5/5 strength in her quadriceps.  She can maintain a straight leg raise.  Sensation is intact to her left foot.     IMAGING: I personally ordered and reviewed the following images:  AP of the pelvis, and left femur x-rays were obtained in clinic today.  The intertrochanteric femur fracture is in excellent position.   No interval displacement.  Fracture line is barely visible.  Hardware remains intact.  No acute injuries are noted.  Impression: Healing left intertrochanteric femur fracture, without hardware failure.  , MD 12/27/2020 11:00 AM

## 2021-06-27 ENCOUNTER — Ambulatory Visit: Payer: Medicare Other | Admitting: Orthopedic Surgery

## 2021-12-07 IMAGING — DX DG CHEST 1V PORT
1 series · 1 of 1 positions shown · non-contrast
Comparison: Two days ago

CLINICAL DATA: Cough.

EXAM:
PORTABLE CHEST 1 VIEW

[chest ap]
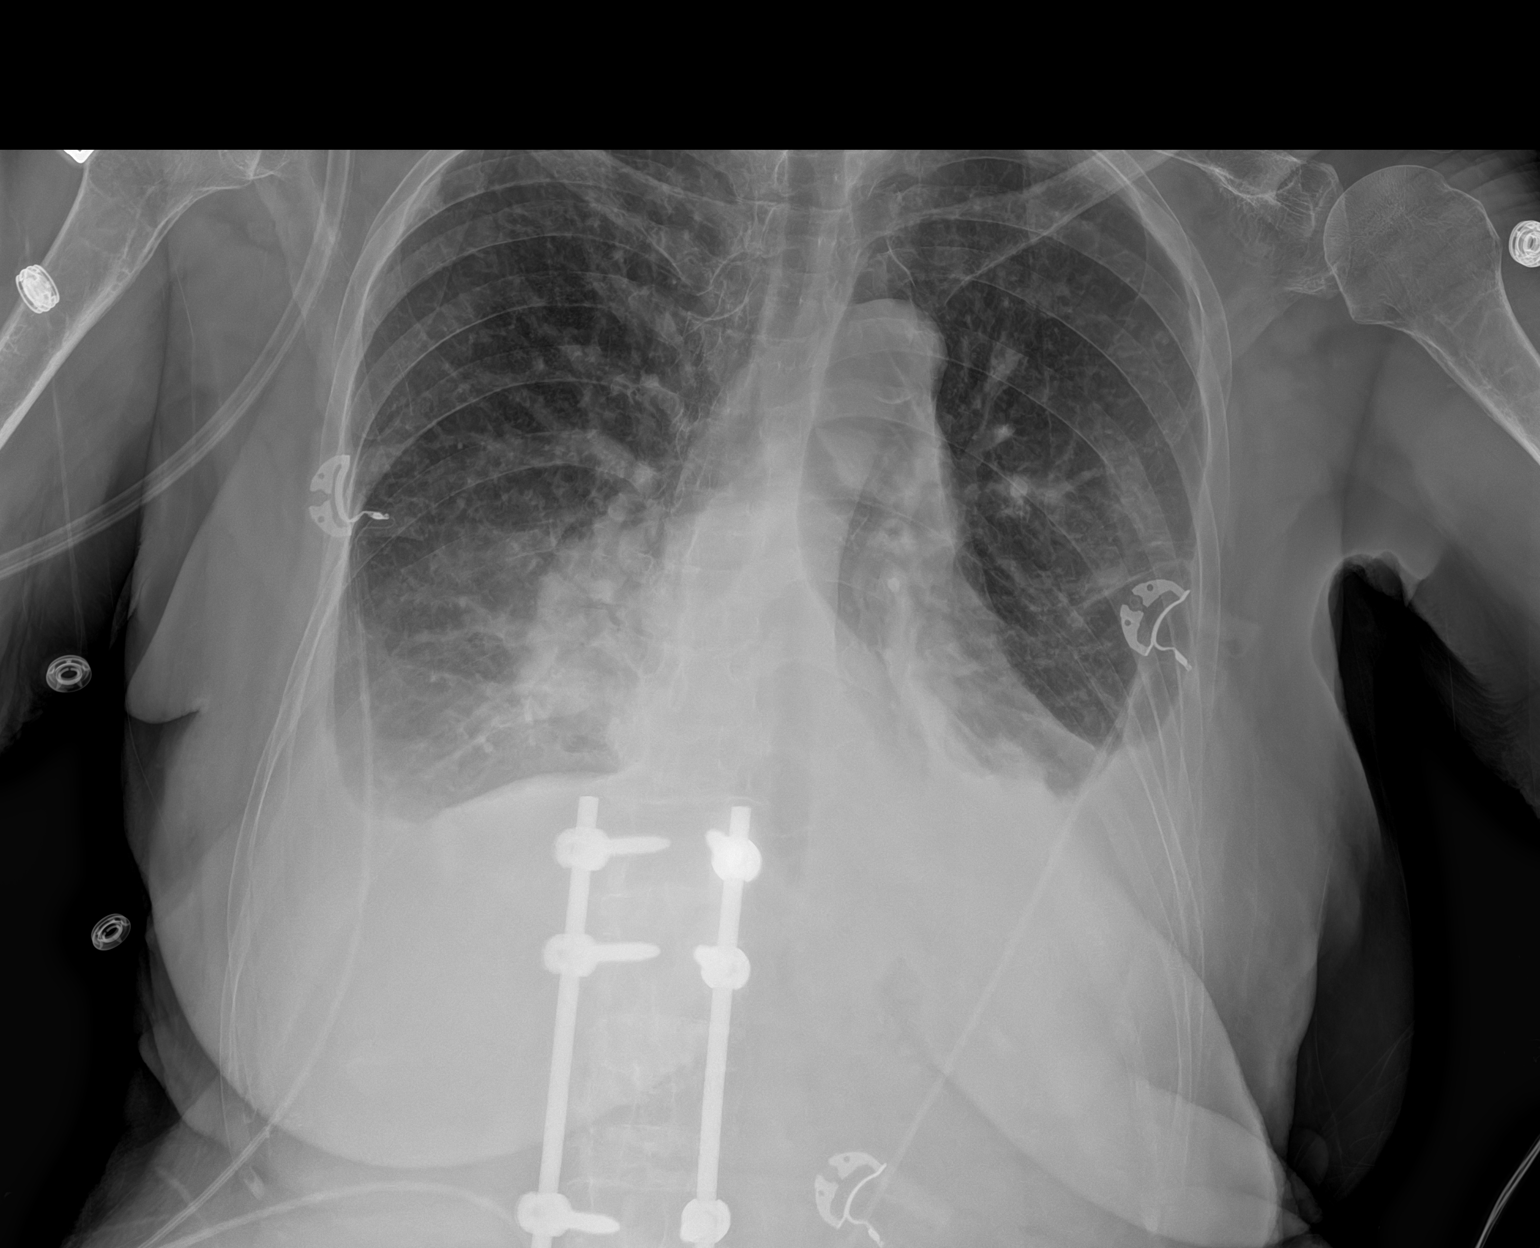

[1 of 1 positions shown; findings below may reference images not displayed]

FINDINGS: Hazy lower chest opacity with bilateral small to moderate pleural
effusion. There has been increased pleural fluid on the right at
least. Cardiomegaly which is partially obscured. No pneumothorax.
Large lung volumes with known COPD.
IMPRESSION: Increased pleural fluid and lower lobe opacity.
